# Patient Record
Sex: Male | Born: 1935 | Race: White | Hispanic: No | Marital: Married | Smoking: Former smoker
Health system: Southern US, Community
[De-identification: ages and names within clinical notes are randomized; demographics above are authoritative.]

## PROBLEM LIST (undated history)

## (undated) DIAGNOSIS — D649 Anemia, unspecified: Secondary | ICD-10-CM

## (undated) DIAGNOSIS — E785 Hyperlipidemia, unspecified: Secondary | ICD-10-CM

## (undated) DIAGNOSIS — I350 Nonrheumatic aortic (valve) stenosis: Secondary | ICD-10-CM

## (undated) DIAGNOSIS — N200 Calculus of kidney: Secondary | ICD-10-CM

## (undated) DIAGNOSIS — I2510 Atherosclerotic heart disease of native coronary artery without angina pectoris: Secondary | ICD-10-CM

## (undated) DIAGNOSIS — R918 Other nonspecific abnormal finding of lung field: Secondary | ICD-10-CM

## (undated) DIAGNOSIS — I4891 Unspecified atrial fibrillation: Secondary | ICD-10-CM

## (undated) DIAGNOSIS — I351 Nonrheumatic aortic (valve) insufficiency: Secondary | ICD-10-CM

## (undated) DIAGNOSIS — I10 Essential (primary) hypertension: Secondary | ICD-10-CM

## (undated) DIAGNOSIS — N289 Disorder of kidney and ureter, unspecified: Secondary | ICD-10-CM

## (undated) DIAGNOSIS — I240 Acute coronary thrombosis not resulting in myocardial infarction: Secondary | ICD-10-CM

## (undated) DIAGNOSIS — K279 Peptic ulcer, site unspecified, unspecified as acute or chronic, without hemorrhage or perforation: Secondary | ICD-10-CM

## (undated) HISTORY — DX: Disorder of kidney and ureter, unspecified: N28.9

## (undated) HISTORY — DX: Unspecified atrial fibrillation: I48.91

## (undated) HISTORY — DX: Hyperlipidemia, unspecified: E78.5

## (undated) HISTORY — PX: FINGER AMPUTATION: SHX636

## (undated) HISTORY — DX: Calculus of kidney: N20.0

## (undated) HISTORY — DX: Nonrheumatic aortic (valve) stenosis: I35.0

## (undated) HISTORY — DX: Nonrheumatic aortic (valve) insufficiency: I35.1

## (undated) HISTORY — PX: BILROTH II PROCEDURE: SHX1232

## (undated) HISTORY — DX: Atherosclerotic heart disease of native coronary artery without angina pectoris: I25.10

## (undated) HISTORY — DX: Acute coronary thrombosis not resulting in myocardial infarction: I24.0

## (undated) HISTORY — DX: Other nonspecific abnormal finding of lung field: R91.8

## (undated) HISTORY — DX: Peptic ulcer, site unspecified, unspecified as acute or chronic, without hemorrhage or perforation: K27.9

## (undated) HISTORY — DX: Anemia, unspecified: D64.9

## (undated) HISTORY — DX: Essential (primary) hypertension: I10

---

## 1998-05-01 HISTORY — PX: CORONARY STENT PLACEMENT: SHX1402

## 1998-10-13 ENCOUNTER — Ambulatory Visit (HOSPITAL_COMMUNITY): Admission: RE | Admit: 1998-10-13 | Discharge: 1998-10-13 | Payer: Self-pay | Admitting: Gastroenterology

## 1998-10-19 ENCOUNTER — Ambulatory Visit (HOSPITAL_COMMUNITY): Admission: RE | Admit: 1998-10-19 | Discharge: 1998-10-19 | Payer: Self-pay | Admitting: Gastroenterology

## 1998-11-06 ENCOUNTER — Encounter: Payer: Self-pay | Admitting: Emergency Medicine

## 1998-11-06 ENCOUNTER — Inpatient Hospital Stay (HOSPITAL_COMMUNITY): Admission: EM | Admit: 1998-11-06 | Discharge: 1998-11-09 | Payer: Self-pay | Admitting: Emergency Medicine

## 2003-09-01 ENCOUNTER — Encounter: Admission: RE | Admit: 2003-09-01 | Discharge: 2003-09-29 | Payer: Self-pay | Admitting: Family Medicine

## 2005-10-15 ENCOUNTER — Observation Stay (HOSPITAL_COMMUNITY): Admission: EM | Admit: 2005-10-15 | Discharge: 2005-10-16 | Payer: Self-pay | Admitting: Emergency Medicine

## 2006-01-15 ENCOUNTER — Encounter: Admission: RE | Admit: 2006-01-15 | Discharge: 2006-01-15 | Payer: Self-pay | Admitting: Cardiology

## 2006-07-20 ENCOUNTER — Encounter: Admission: RE | Admit: 2006-07-20 | Discharge: 2006-07-20 | Payer: Self-pay | Admitting: Cardiology

## 2007-11-11 ENCOUNTER — Encounter: Admission: RE | Admit: 2007-11-11 | Discharge: 2007-11-11 | Payer: Self-pay | Admitting: Cardiology

## 2008-05-11 ENCOUNTER — Encounter: Admission: RE | Admit: 2008-05-11 | Discharge: 2008-05-11 | Payer: Self-pay | Admitting: Cardiology

## 2008-05-19 ENCOUNTER — Inpatient Hospital Stay (HOSPITAL_BASED_OUTPATIENT_CLINIC_OR_DEPARTMENT_OTHER): Admission: RE | Admit: 2008-05-19 | Discharge: 2008-05-19 | Payer: Self-pay | Admitting: Cardiology

## 2008-05-19 HISTORY — PX: CARDIAC CATHETERIZATION: SHX172

## 2008-05-26 ENCOUNTER — Ambulatory Visit: Payer: Self-pay | Admitting: Cardiothoracic Surgery

## 2008-05-29 ENCOUNTER — Ambulatory Visit: Admission: RE | Admit: 2008-05-29 | Discharge: 2008-05-29 | Payer: Self-pay | Admitting: Cardiothoracic Surgery

## 2008-05-29 ENCOUNTER — Encounter: Payer: Self-pay | Admitting: Cardiothoracic Surgery

## 2008-06-03 ENCOUNTER — Ambulatory Visit: Payer: Self-pay | Admitting: Cardiothoracic Surgery

## 2008-06-03 ENCOUNTER — Inpatient Hospital Stay (HOSPITAL_COMMUNITY): Admission: RE | Admit: 2008-06-03 | Discharge: 2008-06-08 | Payer: Self-pay | Admitting: Cardiothoracic Surgery

## 2008-06-03 ENCOUNTER — Encounter: Payer: Self-pay | Admitting: Cardiothoracic Surgery

## 2008-06-04 HISTORY — PX: AORTIC VALVE REPLACEMENT: SHX41

## 2008-06-29 ENCOUNTER — Ambulatory Visit: Payer: Self-pay | Admitting: Cardiothoracic Surgery

## 2008-06-29 ENCOUNTER — Encounter: Admission: RE | Admit: 2008-06-29 | Discharge: 2008-06-29 | Payer: Self-pay | Admitting: Cardiothoracic Surgery

## 2008-07-02 ENCOUNTER — Encounter (HOSPITAL_COMMUNITY): Admission: RE | Admit: 2008-07-02 | Discharge: 2008-09-30 | Payer: Self-pay | Admitting: Cardiology

## 2010-02-27 IMAGING — CT CT CHEST W/O CM
2 of 3 series · 15 of 36 positions shown, 18 images · non-contrast
Comparison: Chest CTs 07/20/2006 and 01/15/2006.

CLINICAL DATA: Follow-up pulmonary nodule.

CT CHEST WITHOUT CONTRAST
TECHNIQUE: Multidetector CT imaging of the chest was performed
following the standard protocol without IV contrast.

[Series 2: routine chest · axial · 0.70mm/px · z∈[-295,+5]mm · 12 of 72 slices shown, 15 images]
[im 6/72  mediastinal]
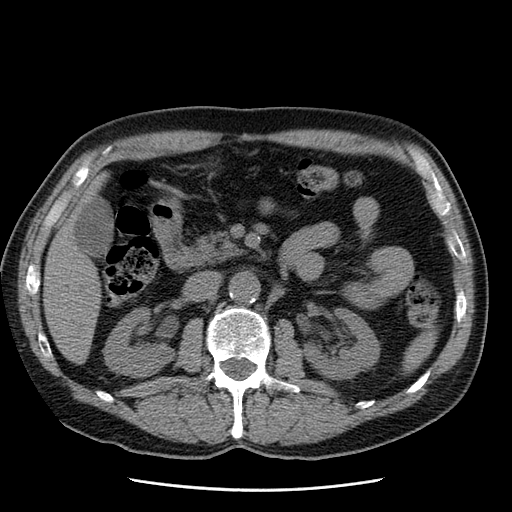
[im 6/72  lung]
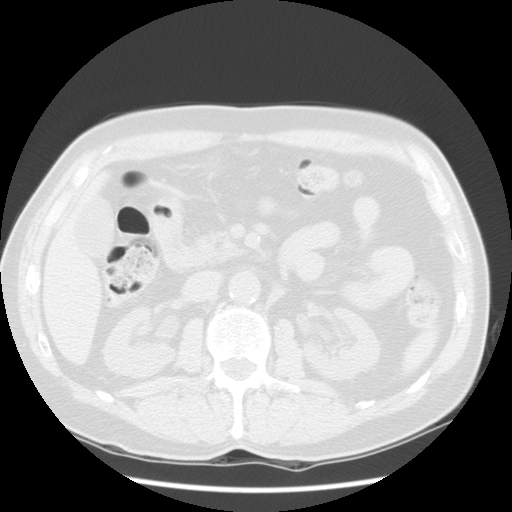
[im 11/72  lung]
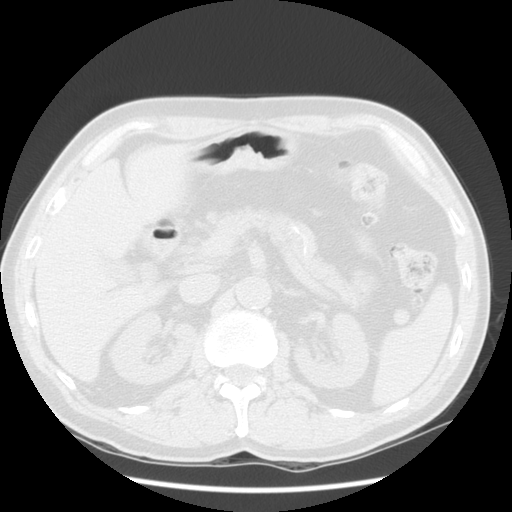
[im 16/72  lung]
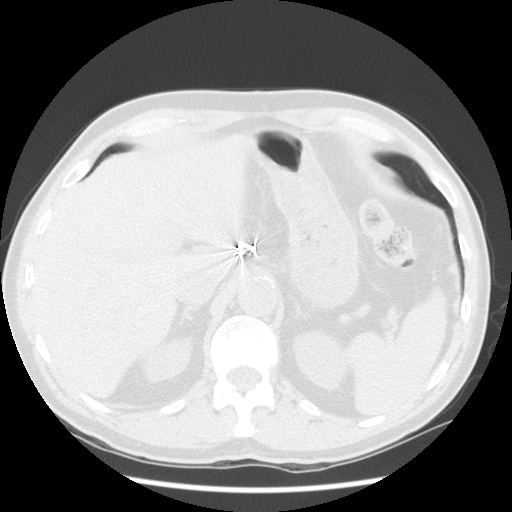
[im 22/72  lung]
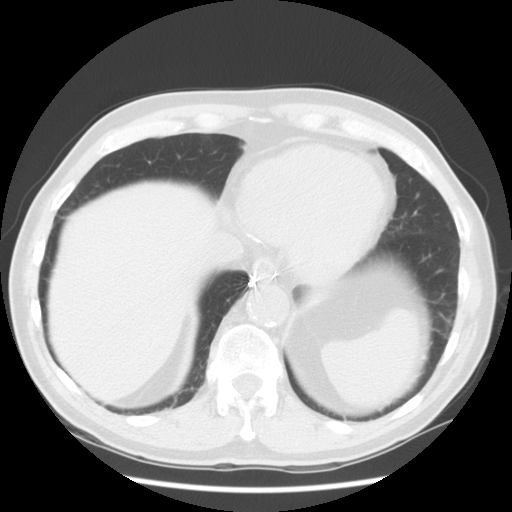
[im 27/72  mediastinal]
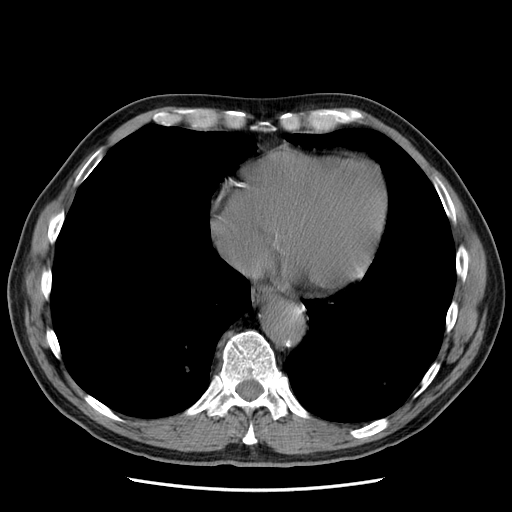
[im 27/72  lung]
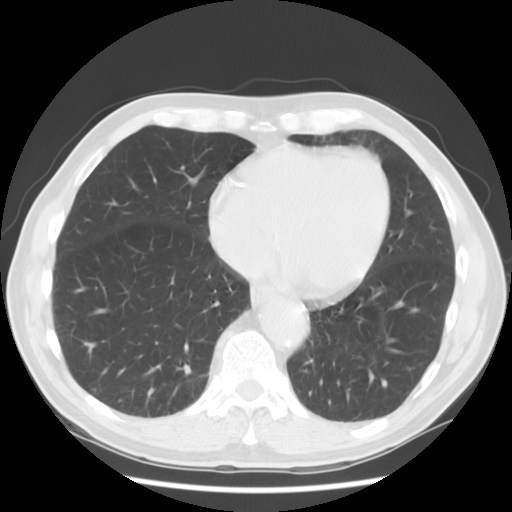
[im 32/72  lung]
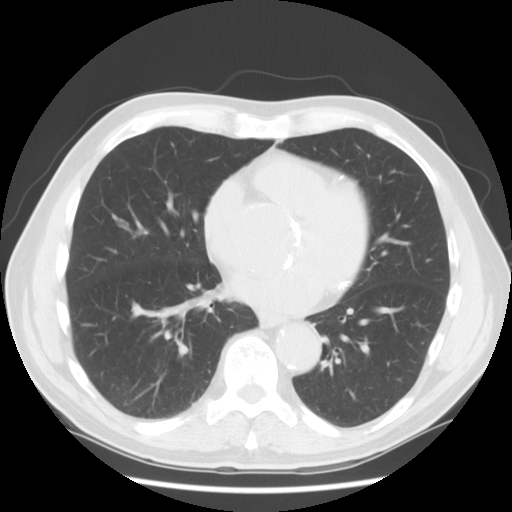
[im 40/72  lung]
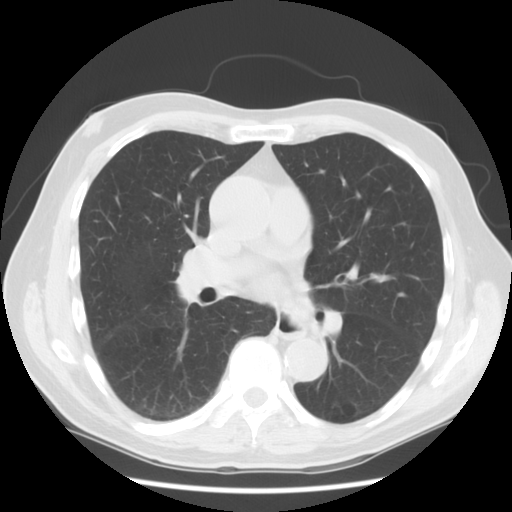
[im 45/72  lung]
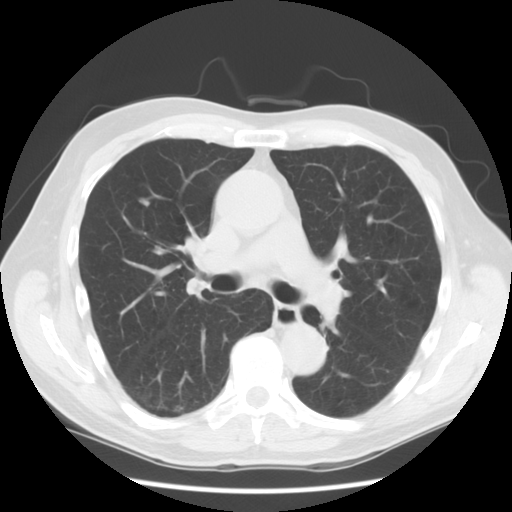
[im 50/72  mediastinal]
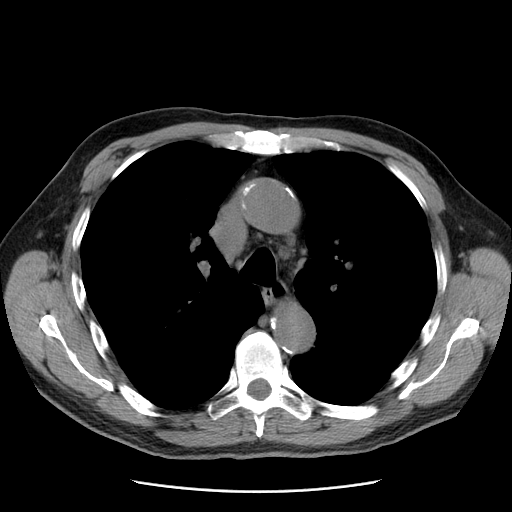
[im 50/72  lung]
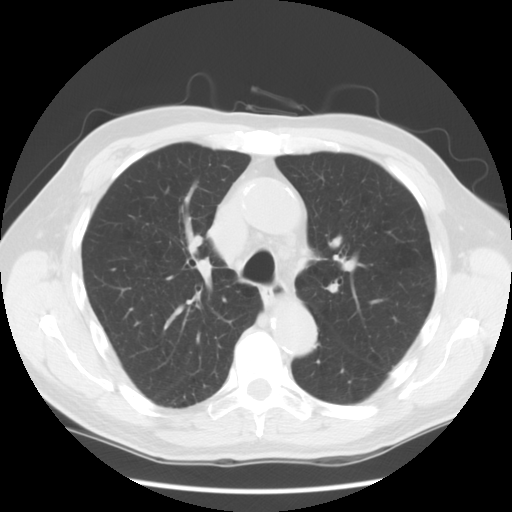
[im 56/72  lung]
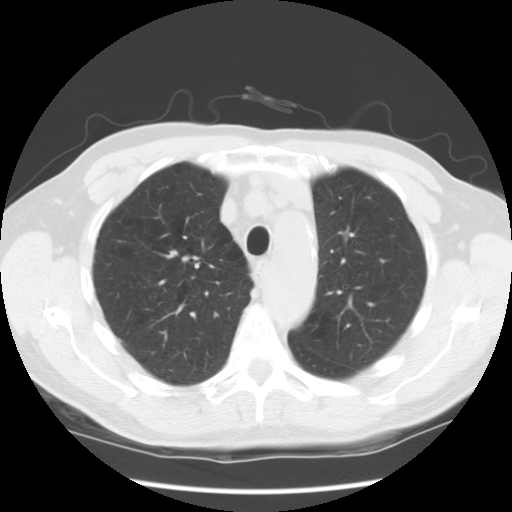
[im 61/72  lung]
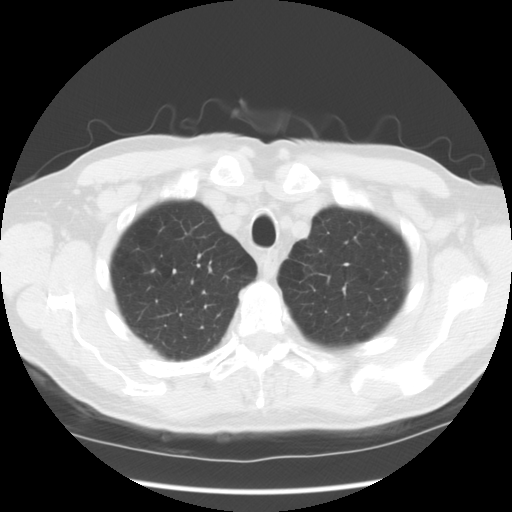
[im 66/72  lung]
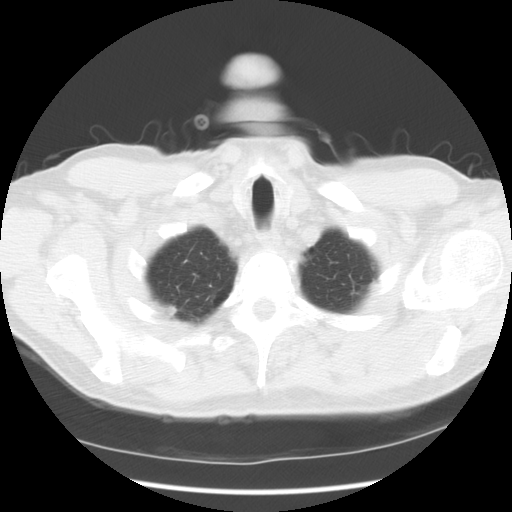

[Series 402: coronal · coronal · 0.70mm/px · 3 of 117 slices shown]
[im 24/117  lung]
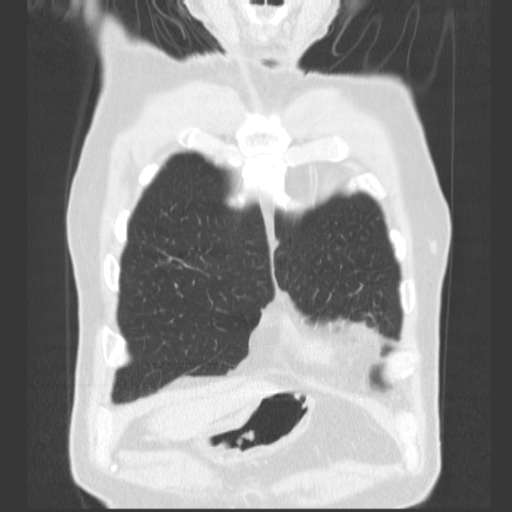
[im 47/117  lung]
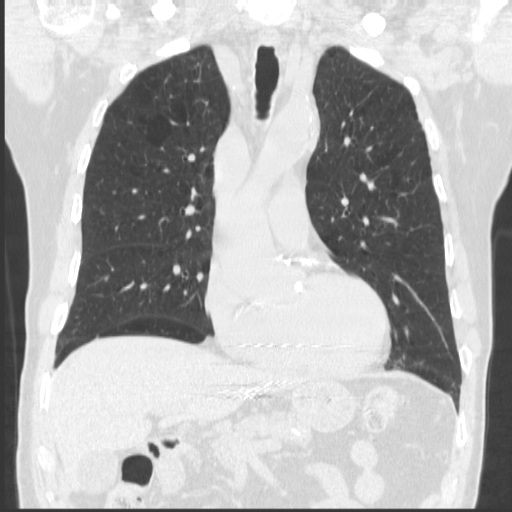
[im 70/117  lung]
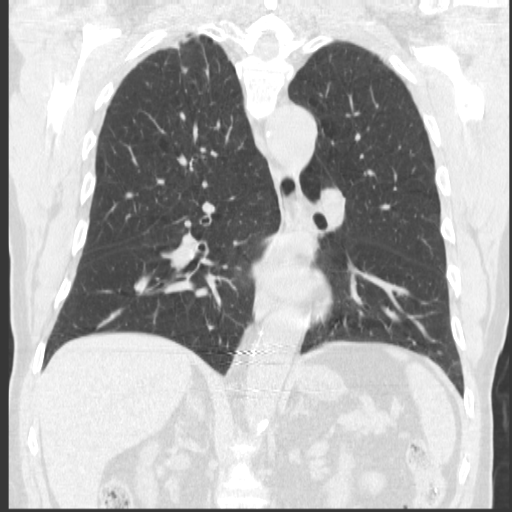

[15 of 36 positions shown; findings below may reference images not displayed]

FINDINGS: There are no enlarged mediastinal or hilar lymph nodes.
Diffuse aortic and coronary artery atherosclerosis appears stable.
There is no pleural or pericardial effusion.

Emphysematous changes are again noted.  There are scattered
subcentimeter nodules in both lungs which are unchanged from the
prior study.  The subpleural density in the superior segment right
lower lobe measuring 6.5 mm on image 32 is unchanged.  No new
pulmonary nodules are identified.

Postsurgical changes in the upper abdomen appear stable.  There is
a small ventral hernia containing only fat.
IMPRESSION: 1.  Overall stable CT of the chest.
2.  Scattered nodularity is unchanged, most compatible with
postinflammatory scarring.
3.  Stable emphysema.
4.  Stable aortic and coronary atherosclerosis.

## 2010-04-20 ENCOUNTER — Ambulatory Visit: Payer: Self-pay | Admitting: Cardiology

## 2010-05-22 ENCOUNTER — Encounter: Payer: Self-pay | Admitting: Cardiology

## 2010-08-15 LAB — COMPREHENSIVE METABOLIC PANEL
ALT: 30 U/L (ref 0–53)
AST: 29 U/L (ref 0–37)
Albumin: 4.2 g/dL (ref 3.5–5.2)
Alkaline Phosphatase: 60 U/L (ref 39–117)
BUN: 19 mg/dL (ref 6–23)
CO2: 20 mEq/L (ref 19–32)
Calcium: 9.2 mg/dL (ref 8.4–10.5)
Chloride: 102 mEq/L (ref 96–112)
Creatinine, Ser: 1.17 mg/dL (ref 0.4–1.5)
GFR calc Af Amer: >60 mL/min (ref >60)
GFR calc non Af Amer: >60 mL/min (ref >60)
Glucose, Bld: 91 mg/dL (ref 70–99)
Potassium: 4.1 mEq/L (ref 3.5–5.1)
Sodium: 135 mEq/L (ref 135–145)
Total Bilirubin: 1 mg/dL (ref 0.3–1.2)
Total Protein: 6.9 g/dL (ref 6.0–8.3)

## 2010-08-15 LAB — BLOOD GAS, ARTERIAL
Acid-base deficit: 0.7 mmol/L (ref 0.0–2.0)
Bicarbonate: 22.9 mEq/L (ref 20.0–24.0)
Drawn by: 181601
FIO2: 0.21 %
O2 Saturation: 96.3 %
Patient temperature: 98.6
TCO2: 24 mmol/L (ref 0–100)
pCO2 arterial: 34.9 mmHg — ABNORMAL LOW (ref 35.0–45.0)
pH, Arterial: 7.434 (ref 7.350–7.450)
pO2, Arterial: 78.2 mmHg — ABNORMAL LOW (ref 80.0–100.0)

## 2010-08-15 LAB — APTT: aPTT: 44 seconds — ABNORMAL HIGH (ref 24–37)

## 2010-08-15 LAB — CBC
HCT: 40.3 % (ref 39.0–52.0)
Hemoglobin: 14 g/dL (ref 13.0–17.0)
MCHC: 34.6 g/dL (ref 30.0–36.0)
MCV: 83 fL (ref 78.0–100.0)
Platelets: 219 10*3/uL (ref 150–400)
RBC: 4.86 MIL/uL (ref 4.22–5.81)
RDW: 12.5 % (ref 11.5–15.5)
WBC: 5.9 10*3/uL (ref 4.0–10.5)

## 2010-08-15 LAB — TYPE AND SCREEN
ABO/RH(D): O POS
Antibody Screen: NEGATIVE

## 2010-08-15 LAB — POCT I-STAT 3, VENOUS BLOOD GAS (G3P V)
pCO2, Ven: 47.7 mmHg (ref 45.0–50.0)
pH, Ven: 7.355 — ABNORMAL HIGH (ref 7.250–7.300)
pO2, Ven: 41 mmHg (ref 30.0–45.0)

## 2010-08-15 LAB — PROTIME-INR
INR: 1.1 (ref 0.00–1.49)
Prothrombin Time: 14 seconds (ref 11.6–15.2)

## 2010-08-15 LAB — URINALYSIS, ROUTINE W REFLEX MICROSCOPIC
Bilirubin Urine: NEGATIVE
Glucose, UA: NEGATIVE mg/dL
Hgb urine dipstick: NEGATIVE
Ketones, ur: NEGATIVE mg/dL
Nitrite: NEGATIVE
Protein, ur: NEGATIVE mg/dL
Specific Gravity, Urine: 1.014 (ref 1.005–1.030)
Urobilinogen, UA: 0.2 mg/dL (ref 0.0–1.0)
pH: 5.5 (ref 5.0–8.0)

## 2010-08-15 LAB — POCT I-STAT 3, ART BLOOD GAS (G3+)
Acid-Base Excess: 1 mmol/L (ref 0.0–2.0)
O2 Saturation: 90 %
pO2, Arterial: 61 mmHg — ABNORMAL LOW (ref 80.0–100.0)

## 2010-08-15 LAB — HEMOGLOBIN A1C
Hgb A1c MFr Bld: 5.8 % (ref 4.6–6.1)
Mean Plasma Glucose: 120 mg/dL

## 2010-08-15 LAB — ABO/RH: ABO/RH(D): O POS

## 2010-08-16 LAB — POCT I-STAT 4, (NA,K, GLUC, HGB,HCT)
Glucose, Bld: 100 mg/dL — ABNORMAL HIGH (ref 70–99)
Glucose, Bld: 101 mg/dL — ABNORMAL HIGH (ref 70–99)
Glucose, Bld: 110 mg/dL — ABNORMAL HIGH (ref 70–99)
Glucose, Bld: 95 mg/dL (ref 70–99)
Glucose, Bld: 97 mg/dL (ref 70–99)
HCT: 20 % — ABNORMAL LOW (ref 39.0–52.0)
HCT: 22 % — ABNORMAL LOW (ref 39.0–52.0)
HCT: 23 % — ABNORMAL LOW (ref 39.0–52.0)
HCT: 28 % — ABNORMAL LOW (ref 39.0–52.0)
HCT: 30 % — ABNORMAL LOW (ref 39.0–52.0)
Hemoglobin: 10.2 g/dL — ABNORMAL LOW (ref 13.0–17.0)
Hemoglobin: 6.8 g/dL — CL (ref 13.0–17.0)
Hemoglobin: 7.5 g/dL — CL (ref 13.0–17.0)
Hemoglobin: 7.8 g/dL — CL (ref 13.0–17.0)
Hemoglobin: 9.5 g/dL — ABNORMAL LOW (ref 13.0–17.0)
Potassium: 3.5 mEq/L (ref 3.5–5.1)
Potassium: 3.6 mEq/L (ref 3.5–5.1)
Potassium: 3.7 mEq/L (ref 3.5–5.1)
Potassium: 3.8 mEq/L (ref 3.5–5.1)
Potassium: 4 mEq/L (ref 3.5–5.1)
Sodium: 138 mEq/L (ref 135–145)
Sodium: 138 mEq/L (ref 135–145)
Sodium: 141 mEq/L (ref 135–145)
Sodium: 141 mEq/L (ref 135–145)
Sodium: 142 mEq/L (ref 135–145)

## 2010-08-16 LAB — POCT I-STAT, CHEM 8
BUN: 16 mg/dL (ref 6–23)
BUN: 24 mg/dL — ABNORMAL HIGH (ref 6–23)
Calcium, Ion: 1.1 mmol/L — ABNORMAL LOW (ref 1.12–1.32)
Calcium, Ion: 1.14 mmol/L (ref 1.12–1.32)
Chloride: 104 mEq/L (ref 96–112)
Chloride: 107 mEq/L (ref 96–112)
Creatinine, Ser: 1.1 mg/dL (ref 0.4–1.5)
Creatinine, Ser: 1.4 mg/dL (ref 0.4–1.5)
Glucose, Bld: 143 mg/dL — ABNORMAL HIGH (ref 70–99)
Glucose, Bld: 190 mg/dL — ABNORMAL HIGH (ref 70–99)
HCT: 26 % — ABNORMAL LOW (ref 39.0–52.0)
HCT: 27 % — ABNORMAL LOW (ref 39.0–52.0)
Hemoglobin: 8.8 g/dL — ABNORMAL LOW (ref 13.0–17.0)
Hemoglobin: 9.2 g/dL — ABNORMAL LOW (ref 13.0–17.0)
Potassium: 4.1 mEq/L (ref 3.5–5.1)
Potassium: 4.3 mEq/L (ref 3.5–5.1)
Sodium: 139 mEq/L (ref 135–145)
Sodium: 139 mEq/L (ref 135–145)
TCO2: 21 mmol/L (ref 0–100)
TCO2: 23 mmol/L (ref 0–100)

## 2010-08-16 LAB — PROTIME-INR
INR: 1.7 — ABNORMAL HIGH (ref 0.00–1.49)
Prothrombin Time: 21.1 seconds — ABNORMAL HIGH (ref 11.6–15.2)

## 2010-08-16 LAB — CBC
HCT: 24 % — ABNORMAL LOW (ref 39.0–52.0)
HCT: 24.1 % — ABNORMAL LOW (ref 39.0–52.0)
HCT: 25.5 % — ABNORMAL LOW (ref 39.0–52.0)
HCT: 26.1 % — ABNORMAL LOW (ref 39.0–52.0)
HCT: 26.3 % — ABNORMAL LOW (ref 39.0–52.0)
HCT: 27.4 % — ABNORMAL LOW (ref 39.0–52.0)
HCT: 28.4 % — ABNORMAL LOW (ref 39.0–52.0)
Hemoglobin: 10 g/dL — ABNORMAL LOW (ref 13.0–17.0)
Hemoglobin: 8.4 g/dL — ABNORMAL LOW (ref 13.0–17.0)
Hemoglobin: 8.4 g/dL — ABNORMAL LOW (ref 13.0–17.0)
Hemoglobin: 8.8 g/dL — ABNORMAL LOW (ref 13.0–17.0)
Hemoglobin: 9.1 g/dL — ABNORMAL LOW (ref 13.0–17.0)
Hemoglobin: 9.3 g/dL — ABNORMAL LOW (ref 13.0–17.0)
Hemoglobin: 9.7 g/dL — ABNORMAL LOW (ref 13.0–17.0)
MCHC: 34.6 g/dL (ref 30.0–36.0)
MCHC: 34.8 g/dL (ref 30.0–36.0)
MCHC: 34.9 g/dL (ref 30.0–36.0)
MCHC: 34.9 g/dL (ref 30.0–36.0)
MCHC: 35.1 g/dL (ref 30.0–36.0)
MCHC: 35.3 g/dL (ref 30.0–36.0)
MCHC: 35.4 g/dL (ref 30.0–36.0)
MCV: 83.1 fL (ref 78.0–100.0)
MCV: 83.3 fL (ref 78.0–100.0)
MCV: 84.1 fL (ref 78.0–100.0)
MCV: 84.1 fL (ref 78.0–100.0)
MCV: 84.6 fL (ref 78.0–100.0)
MCV: 85 fL (ref 78.0–100.0)
MCV: 85.2 fL (ref 78.0–100.0)
Platelets: 101 10*3/uL — ABNORMAL LOW (ref 150–400)
Platelets: 109 10*3/uL — ABNORMAL LOW (ref 150–400)
Platelets: 110 10*3/uL — ABNORMAL LOW (ref 150–400)
Platelets: 110 10*3/uL — ABNORMAL LOW (ref 150–400)
Platelets: 145 10*3/uL — ABNORMAL LOW (ref 150–400)
Platelets: 79 10*3/uL — ABNORMAL LOW (ref 150–400)
Platelets: 88 10*3/uL — ABNORMAL LOW (ref 150–400)
RBC: 2.84 MIL/uL — ABNORMAL LOW (ref 4.22–5.81)
RBC: 2.86 MIL/uL — ABNORMAL LOW (ref 4.22–5.81)
RBC: 2.99 MIL/uL — ABNORMAL LOW (ref 4.22–5.81)
RBC: 3.09 MIL/uL — ABNORMAL LOW (ref 4.22–5.81)
RBC: 3.16 MIL/uL — ABNORMAL LOW (ref 4.22–5.81)
RBC: 3.3 MIL/uL — ABNORMAL LOW (ref 4.22–5.81)
RBC: 3.38 MIL/uL — ABNORMAL LOW (ref 4.22–5.81)
RDW: 12.8 % (ref 11.5–15.5)
RDW: 12.9 % (ref 11.5–15.5)
RDW: 13 % (ref 11.5–15.5)
RDW: 13.1 % (ref 11.5–15.5)
RDW: 13.2 % (ref 11.5–15.5)
RDW: 13.3 % (ref 11.5–15.5)
RDW: 13.4 % (ref 11.5–15.5)
WBC: 11.1 10*3/uL — ABNORMAL HIGH (ref 4.0–10.5)
WBC: 13.2 10*3/uL — ABNORMAL HIGH (ref 4.0–10.5)
WBC: 14 10*3/uL — ABNORMAL HIGH (ref 4.0–10.5)
WBC: 14.9 10*3/uL — ABNORMAL HIGH (ref 4.0–10.5)
WBC: 6 10*3/uL (ref 4.0–10.5)
WBC: 7.6 10*3/uL (ref 4.0–10.5)
WBC: 7.9 10*3/uL (ref 4.0–10.5)

## 2010-08-16 LAB — BASIC METABOLIC PANEL
BUN: 16 mg/dL (ref 6–23)
BUN: 22 mg/dL (ref 6–23)
BUN: 23 mg/dL (ref 6–23)
BUN: 24 mg/dL — ABNORMAL HIGH (ref 6–23)
BUN: 30 mg/dL — ABNORMAL HIGH (ref 6–23)
BUN: 31 mg/dL — ABNORMAL HIGH (ref 6–23)
CO2: 23 mEq/L (ref 19–32)
CO2: 23 mEq/L (ref 19–32)
CO2: 24 mEq/L (ref 19–32)
CO2: 25 mEq/L (ref 19–32)
CO2: 25 mEq/L (ref 19–32)
CO2: 26 mEq/L (ref 19–32)
Calcium: 7.6 mg/dL — ABNORMAL LOW (ref 8.4–10.5)
Calcium: 7.8 mg/dL — ABNORMAL LOW (ref 8.4–10.5)
Calcium: 7.9 mg/dL — ABNORMAL LOW (ref 8.4–10.5)
Calcium: 7.9 mg/dL — ABNORMAL LOW (ref 8.4–10.5)
Calcium: 7.9 mg/dL — ABNORMAL LOW (ref 8.4–10.5)
Calcium: 8.1 mg/dL — ABNORMAL LOW (ref 8.4–10.5)
Chloride: 101 mEq/L (ref 96–112)
Chloride: 105 mEq/L (ref 96–112)
Chloride: 109 mEq/L (ref 96–112)
Chloride: 109 mEq/L (ref 96–112)
Chloride: 109 mEq/L (ref 96–112)
Chloride: 98 mEq/L (ref 96–112)
Creatinine, Ser: 1.27 mg/dL (ref 0.4–1.5)
Creatinine, Ser: 1.34 mg/dL (ref 0.4–1.5)
Creatinine, Ser: 1.34 mg/dL (ref 0.4–1.5)
Creatinine, Ser: 1.35 mg/dL (ref 0.4–1.5)
Creatinine, Ser: 1.39 mg/dL (ref 0.4–1.5)
Creatinine, Ser: 1.51 mg/dL — ABNORMAL HIGH (ref 0.4–1.5)
GFR calc Af Amer: 55 mL/min — ABNORMAL LOW (ref >60)
GFR calc Af Amer: >60 mL/min (ref >60)
GFR calc Af Amer: >60 mL/min (ref >60)
GFR calc Af Amer: >60 mL/min (ref >60)
GFR calc Af Amer: >60 mL/min (ref >60)
GFR calc Af Amer: >60 mL/min (ref >60)
GFR calc non Af Amer: 45 mL/min — ABNORMAL LOW (ref >60)
GFR calc non Af Amer: 50 mL/min — ABNORMAL LOW (ref >60)
GFR calc non Af Amer: 51 mL/min — ABNORMAL LOW (ref >60)
GFR calc non Af Amer: 52 mL/min — ABNORMAL LOW (ref >60)
GFR calc non Af Amer: 52 mL/min — ABNORMAL LOW (ref >60)
GFR calc non Af Amer: 55 mL/min — ABNORMAL LOW (ref >60)
Glucose, Bld: 107 mg/dL — ABNORMAL HIGH (ref 70–99)
Glucose, Bld: 108 mg/dL — ABNORMAL HIGH (ref 70–99)
Glucose, Bld: 119 mg/dL — ABNORMAL HIGH (ref 70–99)
Glucose, Bld: 120 mg/dL — ABNORMAL HIGH (ref 70–99)
Glucose, Bld: 151 mg/dL — ABNORMAL HIGH (ref 70–99)
Glucose, Bld: 152 mg/dL — ABNORMAL HIGH (ref 70–99)
Potassium: 3.9 mEq/L (ref 3.5–5.1)
Potassium: 3.9 mEq/L (ref 3.5–5.1)
Potassium: 4 mEq/L (ref 3.5–5.1)
Potassium: 4 mEq/L (ref 3.5–5.1)
Potassium: 4.2 mEq/L (ref 3.5–5.1)
Potassium: 4.3 mEq/L (ref 3.5–5.1)
Sodium: 130 mEq/L — ABNORMAL LOW (ref 135–145)
Sodium: 133 mEq/L — ABNORMAL LOW (ref 135–145)
Sodium: 136 mEq/L (ref 135–145)
Sodium: 137 mEq/L (ref 135–145)
Sodium: 138 mEq/L (ref 135–145)
Sodium: 139 mEq/L (ref 135–145)

## 2010-08-16 LAB — APTT: aPTT: 51 seconds — ABNORMAL HIGH (ref 24–37)

## 2010-08-16 LAB — GLUCOSE, CAPILLARY
Glucose-Capillary: 106 mg/dL — ABNORMAL HIGH (ref 70–99)
Glucose-Capillary: 108 mg/dL — ABNORMAL HIGH (ref 70–99)
Glucose-Capillary: 109 mg/dL — ABNORMAL HIGH (ref 70–99)
Glucose-Capillary: 111 mg/dL — ABNORMAL HIGH (ref 70–99)
Glucose-Capillary: 116 mg/dL — ABNORMAL HIGH (ref 70–99)
Glucose-Capillary: 116 mg/dL — ABNORMAL HIGH (ref 70–99)
Glucose-Capillary: 119 mg/dL — ABNORMAL HIGH (ref 70–99)
Glucose-Capillary: 120 mg/dL — ABNORMAL HIGH (ref 70–99)
Glucose-Capillary: 122 mg/dL — ABNORMAL HIGH (ref 70–99)
Glucose-Capillary: 122 mg/dL — ABNORMAL HIGH (ref 70–99)
Glucose-Capillary: 123 mg/dL — ABNORMAL HIGH (ref 70–99)
Glucose-Capillary: 138 mg/dL — ABNORMAL HIGH (ref 70–99)
Glucose-Capillary: 139 mg/dL — ABNORMAL HIGH (ref 70–99)
Glucose-Capillary: 159 mg/dL — ABNORMAL HIGH (ref 70–99)
Glucose-Capillary: 160 mg/dL — ABNORMAL HIGH (ref 70–99)
Glucose-Capillary: 161 mg/dL — ABNORMAL HIGH (ref 70–99)
Glucose-Capillary: 179 mg/dL — ABNORMAL HIGH (ref 70–99)
Glucose-Capillary: 94 mg/dL (ref 70–99)
Glucose-Capillary: 96 mg/dL (ref 70–99)
Glucose-Capillary: 98 mg/dL (ref 70–99)
Glucose-Capillary: 99 mg/dL (ref 70–99)

## 2010-08-16 LAB — POCT I-STAT 3, ART BLOOD GAS (G3+)
Acid-Base Excess: 1 mmol/L (ref 0.0–2.0)
Acid-base deficit: 2 mmol/L (ref 0.0–2.0)
Acid-base deficit: 4 mmol/L — ABNORMAL HIGH (ref 0.0–2.0)
Acid-base deficit: 6 mmol/L — ABNORMAL HIGH (ref 0.0–2.0)
Bicarbonate: 20.5 mEq/L (ref 20.0–24.0)
Bicarbonate: 21.2 mEq/L (ref 20.0–24.0)
Bicarbonate: 22.4 mEq/L (ref 20.0–24.0)
Bicarbonate: 26 mEq/L — ABNORMAL HIGH (ref 20.0–24.0)
O2 Saturation: 100 %
O2 Saturation: 95 %
O2 Saturation: 97 %
O2 Saturation: 97 %
Patient temperature: 35.5
Patient temperature: 37.1
Patient temperature: 98.4
TCO2: 22 mmol/L (ref 0–100)
TCO2: 22 mmol/L (ref 0–100)
TCO2: 23 mmol/L (ref 0–100)
TCO2: 27 mmol/L (ref 0–100)
pCO2 arterial: 34.3 mmHg — ABNORMAL LOW (ref 35.0–45.0)
pCO2 arterial: 40.1 mmHg (ref 35.0–45.0)
pCO2 arterial: 41.6 mmHg (ref 35.0–45.0)
pCO2 arterial: 45.7 mmHg — ABNORMAL HIGH (ref 35.0–45.0)
pH, Arterial: 7.261 — ABNORMAL LOW (ref 7.350–7.450)
pH, Arterial: 7.33 — ABNORMAL LOW (ref 7.350–7.450)
pH, Arterial: 7.403 (ref 7.350–7.450)
pH, Arterial: 7.416 (ref 7.350–7.450)
pO2, Arterial: 402 mmHg — ABNORMAL HIGH (ref 80.0–100.0)
pO2, Arterial: 83 mmHg (ref 80.0–100.0)
pO2, Arterial: 89 mmHg (ref 80.0–100.0)
pO2, Arterial: 92 mmHg (ref 80.0–100.0)

## 2010-08-16 LAB — PLATELET COUNT: Platelets: 124 10*3/uL — ABNORMAL LOW (ref 150–400)

## 2010-08-16 LAB — CREATININE, SERUM
Creatinine, Ser: 1.1 mg/dL (ref 0.4–1.5)
GFR calc Af Amer: >60 mL/min (ref >60)
GFR calc non Af Amer: >60 mL/min (ref >60)

## 2010-08-16 LAB — MAGNESIUM
Magnesium: 2.3 mg/dL (ref 1.5–2.5)
Magnesium: 2.3 mg/dL (ref 1.5–2.5)

## 2010-08-16 LAB — HEMOGLOBIN AND HEMATOCRIT, BLOOD
HCT: 24 % — ABNORMAL LOW (ref 39.0–52.0)
Hemoglobin: 8.2 g/dL — ABNORMAL LOW (ref 13.0–17.0)

## 2010-08-16 LAB — RAPID STREP SCREEN (MED CTR MEBANE ONLY): Streptococcus, Group A Screen (Direct): NEGATIVE

## 2010-09-13 NOTE — Cardiovascular Report (Signed)
NAME:  Noah Martin, SPILLER NO.:  [id number]   MEDICAL RECORD NO.:  [id number]          PATIENT TYPE:  OIB   LOCATION:  1961                         FACILITY:  [facility]   PHYSICIAN:  Peter M. Swaziland, M.D.  DATE OF BIRTH:  1929-08-02   DATE OF PROCEDURE:  DATE OF DISCHARGE:  05/19/2008                            CARDIAC CATHETERIZATION   INDICATION FOR PROCEDURE:  A 77 year old white male with known history  of coronary artery disease, prior stenting of the left circumflex  coronary artery, presents with progressive aortic stenosis based on  echocardiographic findings with a bicuspid aortic valve and aortic root  enlargement.  His valve area by echocardiography is 0.7 sq cm with a  mean gradient of 36 mmHg.   PROCEDURES:  1. Right and left heart catheterizations.  2. Coronary and left ventricular angiography.   ACCESS:  Via the right femoral artery and vein using standard Seldinger  technique.   EQUIPMENTS:  A 5-French arterial sheath, 5-French left Judkins 4  catheter, 5-French 3DRC catheter, 4-French pigtail catheter, 7-French  venous sheath with a 7-French balloon-tip Swan-Ganz catheter.   MEDICATIONS:  1. Local anesthesia 1% Xylocaine.  2. Versed 2 mg IV.   CONTRAST:  130 mL of Omnipaque.   HEMODYNAMIC DATA:  Thermodilution cardiac output was 4.4 liters per  minute with an index of 2.3.  Fick cardiac output was calculated at 7.6  liters per minute with an index of 3.9.  Right atrial pressure is 3/2  with a mean of 1 mmHg.  Right ventricular pressure is 24 with an EDP of  5 mmHg.  Pulmonary artery pressure is 24/7 with a mean of 14 mmHg.  Pulmonary capillary wedge pressure is 5/3 with a mean of 2 mmHg.  Aortic  pressure is 114/62 with a mean of 83 mmHg.  Left ventricular pressure is  139 with an EDP of 11 mmHg by pullback.  The aortic valve gradient was  22 mmHg with a mean gradient of 5 mmHg.  Valve area is calculated 1.1 sq  cm with an area index of 0.58.   It was noted that the patient had a  coughing episode at the time of pullback, which may have affected his  hemodynamic measurements with increase in his systolic blood pressure.  There was no significant mitral valve gradient.   ANGIOGRAPHIC DATA:  Left ventriculogram was performed in the RAO view.  This demonstrates normal left ventricular size and contractility with  normal systolic function.  Ejection fraction is estimated 65%.  There is  no regional wall motion abnormality.  There is no significant mitral  insufficiency.   The aortic root angiography was performed in the RAO view also.  This  demonstrates aortic valve is calcified.  The aortic root proximally is  moderately dilated at the sinotubular junction.  There was mild aortic  insufficiency.   Left coronary artery arises and distributes normally.  The left main  coronary artery is normal.   The left anterior descending artery has a 40% ostial stenosis.  There is  diffuse irregularity in the midvessel up to 20-30%.  The first diagonal  branch is a small with an 80% stenosis proximally.  The second diagonal  branch is without significant disease.   The left circumflex coronary artery gives rise to a single large  marginal vessel but then trifurcates.  The proximal to mid circumflex,  there is a stented segment, which is widely patent with less than 20%  residual stenosis.   The right coronary artery arises normally.  It has a 90% stenosis  proximally followed by total occlusion in the midvessel.  There is  filling of right ventricular branch antegrade.  The mid to distal right  coronary artery fills by left-to-right collaterals.   IMPRESSION:  1. Two-vessel obstructive coronary artery disease involving small      diagonal branch.  The right coronary artery is occluded and has      left-to-right collaterals.  2. Normal left ventricular function.  3. Normal pulmonary artery pressures.  4. Moderate aortic stenosis.  5.  Moderate aortic root enlargement with mild aortic insufficiency.           ______________________________  Peter M. Swaziland, M.D.     PMJ/MEDQ  D:  05/19/2008  T:  05/19/2008  Job:  [id number]   cc:   Dr. Foy Guadalajara

## 2010-09-13 NOTE — Op Note (Signed)
NAME:  Noah Martin, Noah Martin NO.:  [id number]   MEDICAL RECORD NO.:  [id number]          PATIENT TYPE:  INP   LOCATION:  2307                         FACILITY:  [facility]   PHYSICIAN:  Sheliah Plane, MD    DATE OF BIRTH:  [birth year]   DATE OF PROCEDURE:  DATE OF DISCHARGE:                               OPERATIVE REPORT   PREOPERATIVE DIAGNOSIS:  Critical aortic stenosis.   POSTOPERATIVE DIAGNOSIS:  Critical aortic stenosis.   SURGICAL PROCEDURES:  Aortic valve replacement with a 25 mm model 2700  [employer] pericardial tissue valve, serial number [id number].  Coronary artery bypass grafting x1 with endovein.   SURGEON:  Sheliah Plane, MD   FIRST ASSISTANT:  Salvatore Decent. Dorris Fetch, MD   BRIEF HISTORY:  The patient is a 77 year old male with progressive  symptoms of dyspnea on exertion, found to have critical aortic stenosis  with the valve area of 0.7.  He has also had known dilatation of his  ascending aorta that has been stable since 2007, right at 4 cm with the  descending aorta of 3.2 cm.  Because of progressive aortic stenosis by  echocardiogram and increasing symptoms, aortic valve replacement was  recommended to the patient.  Because of his age of 26 years, tissue  valve was recommended.  Because of the discussion about replacement of  his ascending aorta and/or root was also discussed with his age of 46  years, and stable size of the aorta for several years and being just 4  cm, it was decided to leave this intact.   DESCRIPTION OF PROCEDURE:  With Swan-Ganz and arterial line monitors in  place, the patient underwent general endotracheal anesthesia without  incidence.  The chest and legs were prepped with Betadine and draped in  usual sterile manner, plus dictated on a separate note is a  transesophageal echo probe that confirmed critical aortic stenosis with  significant amount of calcification and mildly dilated aortic root of  3.8 cm.  The patient  also had a chronically occluded total right  coronary artery and disease in a small first diagonal.  The patient  underwent general endotracheal anesthesia without incidence, and the  chest and legs were prepped with Betadine and draped in the usual  sterile manner.  Using the [employer] endovein harvesting system, the  segment of vein was harvested from the right thigh, was of good quality  and caliber.  Median sternotomy was performed, left pericardium was  opened.  Overall ventricular function appeared preserved.  On  examination of the aorta, it appeared to be marginally, not particularly  thinned out more than suspected and was consistent in size throughout  the ascending aorta and about 4 cm, it was decided to leave it in place.  The patient was systemically heparinized, the ascending aorta was  cannulated without difficulty, the right atrium was cannulated, and  aortic root vent cardioplegia needle was introduced in the ascending  aorta.  The patient was placed on cardiopulmonary bypass 2.4 L per  minute per meter square right superior pulmonary vein vent was placed.  The patient's body temperature was cooled  to 30 degrees.  Aortic  crossclamp was applied and 800 mL of cold blood potassium cardioplegia  was administered.  Attention was turned first to the posterior  descending coronary artery, which arose high along the acute margin of  the heart, then descended across the inferior surface of the heart,  vessel was opened and admitted a 1-1/2-mm probe.  Using running 7-0  Prolene, distal anastomosis was performed with second reverse saphenous  vein graft.  The diagonal that was considered for bypass was extremely  small and decided not to bypass.  Additional cold blood cardioplegia was  administered down the vein graft and also in the aortic root.  A  transverse aortotomy was then performed and this gave good exposure of a  highly calcified aortic valve  with 3 cusps, 2 fused with the  left  coronary cusp being asymmetrically smaller than the other 2.  The valve  was excised and the annulus decalcified.  A 25 pericardial tissue valve  seated well, #2 Tycron pledget sutures were placed circumferentially  around the annulus and the valve was seated easily without obstruction  of the left main coronary artery.  With the valve well seated, care was  taken to remove all loose calcific debris and the aortotomy was closed  with horizontal mattress Prolene sutures over felt strips and a second  layer of running 4-0 running stitch.  The cardioplegia cannulation site  catheter was then removed and a single punch aortotomy was performed.  The vein graft to the right coronary artery was then trimmed to the  appropriate length and anastomosed to the ascending aorta.  Before  complete closure of the proximal anastomosis of the heart was allowed to  passively de-air.  Aortic crossclamp was removed with total crossclamp  time of 91 minutes.  The patient required electric defibrillation to  return to a sinus rhythm.  A 18-gauge needle was introduced in the left  ventricular apex to further de-air the heart.  With the patient in a  sinus rhythm, atrial and ventricular pacing wires were applied.  The  echocardiogram showed good function of the aortic valve without aortic  insufficiency.  The right superior pulmonary vein vent was removed.  The  patient was then ventilated and weaned from cardiopulmonary bypass  without difficulty.  He remained hemodynamically stable.  He was  decannulated in usual fashion.  Protamine sulfate was administered with  operative field hemostatic.  The pericardium was loosely reapproximated.  Two Blake drains, one behind the heart, and one in the substernal area  were left in place.  Sternum was closed with #6 stainless steel wire.  Fascia was closed with interrupted 0 Vicryl, running 3-0 Vicryl  subcutaneous tissue, 4-0 subcuticular stitch in skin edges.  Dry   dressings were applied.  Sponge and needle count was reported as correct  at completion of the procedure.  The patient tolerated the procedure  without obvious complication and was transferred to Surgical Intensive  Care Unit for further postoperative care.      Sheliah Plane, MD  Electronically Signed     EG/MEDQ  D:  06/04/2008  T:  06/05/2008  Job:  [id number]   cc:   Peter M. [place]il[place], M.D.

## 2010-09-13 NOTE — Consult Note (Signed)
NEW PATIENT CONSULTATION   NORTON, BIVINS  DOB:  1930/04/19                                        June 11, 2008  CHART #:  [id number]   The patient was seen in the office today in consultation for critical  aortic stenosis referred by Dr. Peter Swaziland.  Please see the patient's  history and physical dictated on the same day for details of his office  visit.   Sheliah Plane, MD  Electronically Signed   EG/MEDQ  D:  06/11/2008  T:  06/12/2008  Job:  [id number]

## 2010-09-13 NOTE — Discharge Summary (Signed)
NAME:  Noah Martin, Noah Martin NO.:  [id number]   MEDICAL RECORD NO.:  [id number]          PATIENT TYPE:  INP   LOCATION:  2039                         FACILITY:  [facility]   PHYSICIAN:  Sheliah Plane, MD    DATE OF BIRTH:  [birth year]   DATE OF ADMISSION:  06/03/2008  DATE OF DISCHARGE:                               DISCHARGE SUMMARY   FINAL DIAGNOSES:  1. Critical aortic stenosis.  2. Coronary artery disease.   IN-HOSPITAL DIAGNOSES:  1. Acute blood loss anemia postoperatively.  2. Acute renal insufficiency postoperatively.  3. Intraoperative and postoperative atrial fibrillation.   SECONDARY DIAGNOSES:  1. History of nephrolithiasis.  2. Chronic occlusion of right coronary artery.  3. History of peptic ulcer disease.  4. History of dumping syndrome.  5. History of chronic pulmonary nodules, followed by serial CT scans      in 2008-2009 without change.  6. 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  7. 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   IN-HOSPITAL OPERATIONS AND PROCEDURES:  1. Aortic valve replacement of the 25-mm [employer]      pericardial tissue valve.  2. Coronary artery bypass grafting x1 with Endovein.   THE PATIENT'S HISTORY AND PHYSICAL AND HOSPITAL COURSE:  The patient is  a 77 year old male with progressive symptoms of dyspnea on exertion and  found to have critical aortic stenosis with a valve area of 0.7.  He has  also had known dilatation of his ascending aorta that has been stable  since 2007, ascending of 4 cm with the descending aorta of 3.2 cm.  Because of progressive aortic stenosis by echocardiogram and increased  symptoms, an aortic valve replacement was recommended to the patient.  The patient underwent cardiac catheterization which showed two-vessel  obstructive disease involving a small diagonal branch and a chronically  occluded  right coronary artery.  Following this, the patient was seen  and evaluated by Dr. Tyrone Sage.  Dr. Tyrone Sage discussed with the patient  undergoing aortic valve replacement with possible coronary artery bypass  grafting and possible aortic root replacement.  He discussed risks and  benefits with the patient.  The patient acknowledged understanding and  agreed to proceed.  Surgery was scheduled for June 03, 2008.  For  details of the patient's past medical history and physical exam, please  see dictated H&P.   The patient was taken to the operating room on June 03, 2008 where he  underwent an aortic valve replacement using a 25 mm [employer]  pericardial tissue valve.  He also had coronary bypass grafting x1 using  Endovein.  The patient tolerated this procedure well and was transferred  to the intensive care unit in stable condition.  Noted intraoperatively,  the patient did have a brief run of atrial fibrillation, but was noted  to be in normal sinus rhythm at finish of case.  Postoperatively, the  patient was noted to be hemodynamically stable.  He was extubated  evening of surgery.  Post extubation, the patient was  noted to be alert  and oriented x4 and neuro intact.  On the intensive care unit, a chest x-  ray obtained was clear.  Chest tubes were discontinued in normal  fashion.  All drips were able to be weaned and discontinued.  The  patient remained in normal sinus rhythm.  Blood pressure was stable low.  He did have mild acute blood loss anemia but did not require any  transfusion.  The patient was out of bed, ambulating well with cardiac  rehab.  He was progressing well and felt stable for transfer out to 2000  on postop day 2.  On the floor, the patient continued to progress well.  He did have another run of atrial fibrillation, but converted back to  normal sinus rhythm on his own.  He has remained in normal sinus rhythm  since.  Blood pressure remained stable  low.  He had been started on  Lopressor, but it is currently held due to blood pressure.  We will  continue to monitor.  The patient's hemoglobin and hematocrit dropped  slightly, but he remained asymptomatic.  The patient was started on  iron.  Last hemoglobin and hematocrit was 8.4 and 24.1.  Postoperatively, the patient did have slight acute renal insufficiency  with creatinine increased to 1.51.  This was monitored and started to  trend down by postop day 4 to 1.39.  The patient continued to progress  with cardiac rehab.  He was tolerating diet well.  No nausea, vomiting  noted.  All incisions were noted to be clean, dry, and intact and  healing well.   Postop day #4, the patient was noted to be afebrile.  Heart rate and  blood pressure stable.  He was satting greater than 98% on room air.  Most recent lab work showed sodium of 130, potassium of 4.2, chloride of  98, bicarb of 26, BUN of 31, creatinine of 1.39, glucose of 108.  White  blood cell count 6.0, hemoglobin of a 8.4, hematocrit 24, platelet count  of 109.  The patient is tentatively ready for discharge home in the a.m.  pending he remained stable.   FOLLOWUP APPOINTMENTS:  A followup appointment will be arranged with Dr.  Tyrone Sage in 3 weeks.  Our office will contact the patient with this  information.  The patient will need to obtain AP and lateral chest x-ray  30 minutes prior to his appointment.  He will need to follow up with Dr.  Swaziland in 2 weeks.  He will need to contact Dr. Elvis Coil office to make  these arrangements.   ACTIVITY:  The patient was instructed no driving until released to do  so.  No heavy lifting over 10 pounds.  He is told to ambulate 3-4 times  per day, progress as tolerated, and continue his breathing exercises.   INCISIONAL CARE:  The patient was told to shower, washing his incisions  using soap and water.  He is to contact the office if he develops any  drainage or openings from any of his  incision sites.   DIET:  The patient is educated on diet to be low fat, low salt.   DISCHARGE MEDICATIONS:  1. Prevacid 30 mg daily.  2. Vytorin 10/40 mg daily.  3. Detrol LA 4 mg daily.  4. Elocon cream 0.1% p.r.n.  5. Hydrocortisone cream p.r.n.  6. Aspirin 325 mg daily.  7. Metamucil daily p.r.n.  8. Ferrous sulfate 325 mg daily.  9. Folic acid  1 mg daily.  10.Vicodin 1-2 tablets q.4-6 h. p.r.n.      Theda Belfast, [place]      Sheliah Plane, MD  Electronically Signed    KMD/MEDQ  D:  Apr 04, 202010  T:  Apr 04, 202010  Job:  [id number]   cc:   Peter M. [place], M.D.

## 2010-09-13 NOTE — Assessment & Plan Note (Signed)
OFFICE VISIT   Noah Martin, Noah Martin  DOB:  [birth year]                                        June 29, 2008  CHART #:  [id number]   The patient comes in today for postoperative followup.  He is status  post CABG x1 and aortic valve replacement on June 03, 2008.  His  postoperative course was uneventful and he was discharged home in good  condition.  Since his discharge, he has continued to progress well.  He  states that he still tires easily, but is gradually improving his  stamina.  His appetite is still little off.  He saw Dr. Swaziland last week  and was given a good report and asked to follow up again in June.  Overall, he has no new complaints today.   PHYSICAL EXAMINATION:  VITAL SIGNS:  Blood pressure is 129/83, pulse is  76, respirations 16, and O2 sat 98% on room air.  SKIN:  His incisions have all healed well without erythema or drainage.  STERNUM:  Stable to palpation.  HEART:  Regular rate and rhythm without murmurs, rubs, or gallops.  LUNGS:  Clear to auscultation.  EXTREMITIES:  No edema.   Chest x-ray shows normal postoperative changes with no effusion today.   ASSESSMENT AND PLAN:  The patient is doing very well, status post aortic  valve replacement, coronary artery bypass graft.  He has been contacted  by cardiac rehab and will begin the program this week.  I have released  him to start driving at this point and increase his  activity level.  We will see him back as needed for followup and he is  to continue to follow up as directed with Dr. Swaziland.   Sheliah Plane, MD  Electronically Signed   GC/MEDQ  D:  06/29/2008  T:  06/29/2008  Job:  [id number]   cc:   Peter M. [place], M.D.

## 2010-09-13 NOTE — H&P (Signed)
NAME:  Noah Martin, Noah Martin NO.:  [id number]   MEDICAL RECORD NO.:  [id number]          PATIENT TYPE:  INP   LOCATION:                               FACILITY:  [facility]   PHYSICIAN:  Sheliah Plane, MD    DATE OF BIRTH:  05-23-1929   DATE OF ADMISSION:  06/02/2008  DATE OF DISCHARGE:                              HISTORY & PHYSICAL   REQUESTING PHYSICIAN:  Peter M. Swaziland, MD   PRIMARY CARE PHYSICIAN:  Elana Alm. Reade, MD   REASON FOR CONSULTATION:  Aortic stenosis, aortic insufficiency, dilated  aortic root.   HISTORY OF PRESENT ILLNESS:  The patient is a 77 year old male who Dr.  Swaziland has been following over the years with known coronary occlusive  disease.  He now presents with increasing exertionally related shortness  of breath and serial echocardiogram that showed progressive aortic  stenosis.  In 2008, aortic valve had a mean gradient of 24, valve area  of 1.07 sq cm.  Most recently, an echo showed that the valve grade had  progressed to 60 mm with a mean gradient of 36 mm and a valve area of  0.7.  His LV function remained normal.  In addition, he is noted to have  a dilated aortic root.  CT scan done in 2008 and a followup scan in 2009  showed the mid thoracic aorta being approximately 4 cm; however, the  roots at cardiac catheterization appeared more dilated.   The patient notes that he has had increasing difficulty with exertional  shortness of breath.  He remains active, but has cut his activities back  to avoid symptoms.  He denies any pedal edema, denies any syncope.  He  has very rare episodes of lightheadedness.  He denies angina.  In  anticipation of possible need for valve surgery, the patient has had  work done earlier this year and because of his progressive findings on  echocardiogram and symptoms, recently underwent cardiac catheterization  by Dr. Swaziland.   Previous cardiac history includes a stent placement in the circumflex  coronary artery  in 2000.  He has known hypertension treated, known  hyperlipidemia treated.  Denies diabetes.  He was a remote smoker, but  quit more than 40 years ago.  Denies any previous stroke.  Denies renal  insufficiency.   Past medical history is significant for:  1. Kidney stones.  2. Chronic occlusion of right coronary artery.  3. History of peptic ulcer disease.  4. History of dumping syndrome.  5. History of chronic pulmonary nodules followed by serial CT scan in      2008 and 2009 without change.   Previous surgery include:  1. Gastric outlet obstruction secondary to peptic ulcer disease      leading to a Billroth II procedure.  Since then, he has been      bothered by dumping, this was 39 years ago.  2. 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   SOCIAL HISTORY:  The patient is married, lives with his wife.  He is a  retired Lexicographer, rarely drinks alcohol.   Medications  include,  1. Aspirin 325 mg a day.  2. Diovan/hydrochlorothiazide 80/12.5 mg daily.  3. Vytorin 10/40 daily.  4. Prevacid 30 mg a day.  5. Detrol daily.   ALLERGIES:  None known.   FAMILY HISTORY:  The patient has a younger brother who has had coronary  artery bypass surgery.  His mother died with myocardial infarction at  age 27.   CARDIAC REVIEW OF SYSTEMS:  Negative for chest pain or resting shortness  of breath.  He does have exertional shortness of breath and rare  presyncope.  Denies syncope, palpitations, lower extremity edema, or  orthopnea.   GENERAL REVIEW OF SYSTEMS:  Denies any change in weight.  Denies  nocturia, but does have urinary frequency, which is somewhat improved in  the past month with Detrol.  He does have a history of dumping.  He  notes that his cold intolerance has gotten worse.  He denies any blood  in the stool.  He has never had a colonoscopy and he says he never will.  Denies any amaurosis or TIAs.   PHYSICAL EXAMINATION:  VITAL SIGNS:  Blood pressure is  137/83, pulse is  57, respiratory rate 18, O2 sats 97%, height 60, and weight 160 pounds.  GENERAL:  The patient is awake, alert.  Neurologically intact.  NECK:  I do not appreciate any carotid bruits.  LUNGS:  Clear bilaterally.  CARDIAC:  Reveals harsh holosystolic murmur heard throughout the  precordium.  ABDOMEN:  He has a midline abdominal incision from gastric surgery.  EXTREMITIES:  Lower extremities appear to have adequate pain for bypass.  He has 2+ DP and DT pulses bilaterally.  NEUROLOGIC:  Grossly intact.   Cardiac echo, the patient has dilated aortic root, aortic stenosis,  aortic insufficiency, and bicuspid aortic valve.  Peak gradient across  the aortic valve is 3.9 mm/sec with estimated valve area of 0.7 sq cm.  Cardiac catheterization shows two-vessel obstructive disease involving  the small diagonal branch and chronically occluded right coronary artery  confirms diagnosis of aortic stenosis, so the gradient was not as great  as with echo, has moderate enlargement of the aortic root with mild-to-  moderate aortic insufficiency.  The previously placed circumflex stent  appears without significant disease.   IMPRESSION:  1. The patient with progressive aortic stenosis, now symptomatic with      increasing shortness of breath with exertion and presyncope.  2. History of Billroth II procedure from gastric resection for      obstructing ulcer disease 39 years ago.  3. The patient has never had a colonoscopy.   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  I agree with Dr. Elvis Coil  recommendation to proceed with aortic valve replacement.  At the time of  surgery, we will also evaluate his aortic root.  The mid ascending aorta  is approximately 4 cm; however, the root appears to be slightly more  enlarged.  If this is the case,  we will plan to place a tissue valve  either a tissue conduit and replace portion of the ascending aorta or if  the aortic root does not appear to be significantly enlarged, then just  aortic valve replacement and possible bypass to diagonal in right.  The  risks of surgery including death, infection, stroke, myocardial  infarction, bleeding, blood transfusion all have been discussed  with the  patient in detail and with his wife, and his questions have been  answered.  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  We will tentatively plan for surgery on  February 3.      Sheliah Plane, MD  Electronically Signed     EG/MEDQ  D:  05/26/2008  T:  05/27/2008  Job:  [id number]   cc:   Peter M. Swaziland, M.D.  Robert A. Nicholos Johns, M.D.

## 2010-09-13 NOTE — H&P (Signed)
NAME:  Noah Martin, Noah Martin NO.:  [id number]   MEDICAL RECORD NO.:  [id number]           PATIENT TYPE:   LOCATION:                                 FACILITY:   PHYSICIAN:  Peter M. Swaziland, M.D.  DATE OF BIRTH:  November 09, 1929   DATE OF ADMISSION:  05/19/2008  DATE OF DISCHARGE:                              HISTORY & PHYSICAL   HISTORY OF PRESENT ILLNESS:  Noah Martin is a 77 year old white male  admitted for diagnostic cardiac catheterization for further evaluation  of severe aortic stenosis and planning for valve replacement surgery.  He has a known history of coronary artery disease.  He had a non-Q-wave  myocardial infarction in 2000.  At that time, he was noted to have right  coronary occlusion, which was felt to be an old lesion.  He had an  ulcerated lesion in the left circumflex coronary artery, which was  successfully stented.  This was stented with a 3.0 x 31 mm Niroyal  stent.  The patient has done well since that time without significant  anginal symptoms.  However since 2000, he has had progressive aortic  stenosis.  At his initial evaluation in 2000, he had mild aortic  stenosis with moderate aortic insufficiency.  On followup in 2004, his  mean gradient was 10 mmHg with mild aortic stenosis.  He did have some  aortic root enlargement.  This has been followed in 2008.  He had  progression of his aortic stenosis with a mean gradient of 24 mmHg,  valve area of 1.07 sq. cm and more recently, he had progressed to a peak  gradient of 60 mmHg, a mean gradient of 36 mmHg, and valve area 0.7 sq.  cm.  His left ventricular function has remained normal.  In order to  prepare the patient for planned valve surgery, he did have dental  evaluation this year and required a root canal.  He has also had  multiple nodules noted in his lungs on CT scanning, so he had a followup  CT scan in July 2009.  This showed scattered nodularity that was  unchanged from prior evaluation.  It  was felt to be most compatible with  post-inflammatory scarring.  He did have some emphysema noted.  The  patient currently denies any significant chest pain, shortness of  breath, dizziness, or syncope.  However given the progressive nature of  his aortic valve disease, it was felt that he is going to require aortic  valve replacement and possible assessment of his aortic root, also need  to reevaluate his coronary status.  He is now admitted for diagnostic  cardiac catheterization.   PAST MEDICAL HISTORY:  1. Coronary artery disease as noted above with previous stenting of      the left circumflex coronary artery.  2. He has a known occlusion of the right coronary artery.  3. He has a history of peptic ulcer disease and is status post      gastrectomy with Billroth II anastomosis in the past.  4. He has had a prior history of dumping syndrome.  5. He  has severe aortic stenosis.  6. He has a history of hypertension and hypercholesterolemia.  7. He is felt to have a bicuspid aortic valve and echoes have also      demonstrated moderate aortic insufficiency as well as enlargement      of the proximal aorta.  8. He has a history of chronic pulmonary nodules and prior history of      anemia.   CURRENT MEDICATIONS:  1. Aspirin 325 mg per day.  2. Diovan HCT 80/12.5 mg daily.  3. Vytorin 10/40 mg per day.  4. Prevacid 30 mg per day.  5. Detrol daily.   SOCIAL HISTORY:  The patient is married.  He has 3 children.  He is a  retired Paramedic.  He denies tobacco or alcohol use.   FAMILY HISTORY:  He has a younger brother who has had coronary artery  bypass surgery.  His mother died of myocardial infarction at age 32.   REVIEW OF SYSTEMS:  His abdominal cramping and diarrhea have actually  been doing well recently.  He does note some urinary frequency.  He has  had no history of TIA or stroke.  He has had no cough, fever, or chills.  Denies any recent change in appetite.  He has had  no edema, orthopnea or  PND.  All other systems were reviewed and are negative.   PHYSICAL EXAMINATION:  GENERAL:  The patient is a well-developed white  male in no apparent distress.  VITAL SIGNS:  His weight is 167, blood pressure is 124/70, pulse 68 and  regular.  HEENT:  He is normocephalic, atraumatic.  His pupils equal, round, and  reactive to light and accommodation.  Conjunctivae are clear.  Oropharynx is clear.  He has got good dental repair.  NECK:  Supple without JVD, adenopathy, thyromegaly.  His carotid  upstrokes are delayed with radiated murmur.  LUNGS:  Clear to auscultation and percussion.  CARDIAC:  A harsh grade 3/6 holosystolic murmur heard at the right upper  sternal border and apex.  He has no S3.  ABDOMEN:  Soft and nontender without masses or bruits.  His bowel sounds  are positive.  He has some old surgical scars.  EXTREMITIES:  Femoral and pedal pulses are 2+ and symmetric.  He has no  edema or phlebitis.  SKIN:  Warm and dry.  NEUROLOGIC:  He is alert and oriented x4.  Cranial nerves II through XII  are intact.  He has no focal findings.   LABORATORY DATA:  Chest x-ray showed diffuse coarse interstitial  densities, which are chronic.  Otherwise, stable chest.  His coags were  normal.  His glucose was 96, BUN 20, creatinine 1.3, sodium 142,  potassium 4.2, chloride 102, CO2 is 30, calcium is 9.9.  White count is  5500, hemoglobin 13.9, hematocrit 41.7, platelets 250,000.  ECG shows  sinus bradycardia, otherwise, normal.   IMPRESSION:  1. Severe aortic stenosis with moderate aortic insufficiency.  He also      has mild-to-moderate aortic root enlargement.  2. Coronary artery disease with remote non-Q-wave myocardial      infarction.  Status post stenting of the left circumflex coronary      artery in 2000.  Known chronic right coronary artery occlusion.  3. History of peptic ulcer disease status post gastrectomy and      Billroth II anastomosis.  4.  Hypertension.  5. Hyperlipidemia.  6. History of pulmonary nodules, stable.   PLAN:  We will  proceed with diagnostic right and left heart  catheterization, coronary angiography and preparation for surgery.           ______________________________  Peter M. Swaziland, M.D.     PMJ/MEDQ  D:  05/13/2008  T:  05/14/2008  Job:  [id number]   cc:   Dr. Benjamine Sprague

## 2010-09-15 IMAGING — CR DG CHEST 2V
2 series · 2 of 2 positions shown · non-contrast
Comparison: 05/11/2008

CLINICAL DATA: Aortic stenosis with coronary artery disease and
hypertension.  Pre admission evaluation

CHEST - 2 VIEW

[view not recorded (1 of 2)]
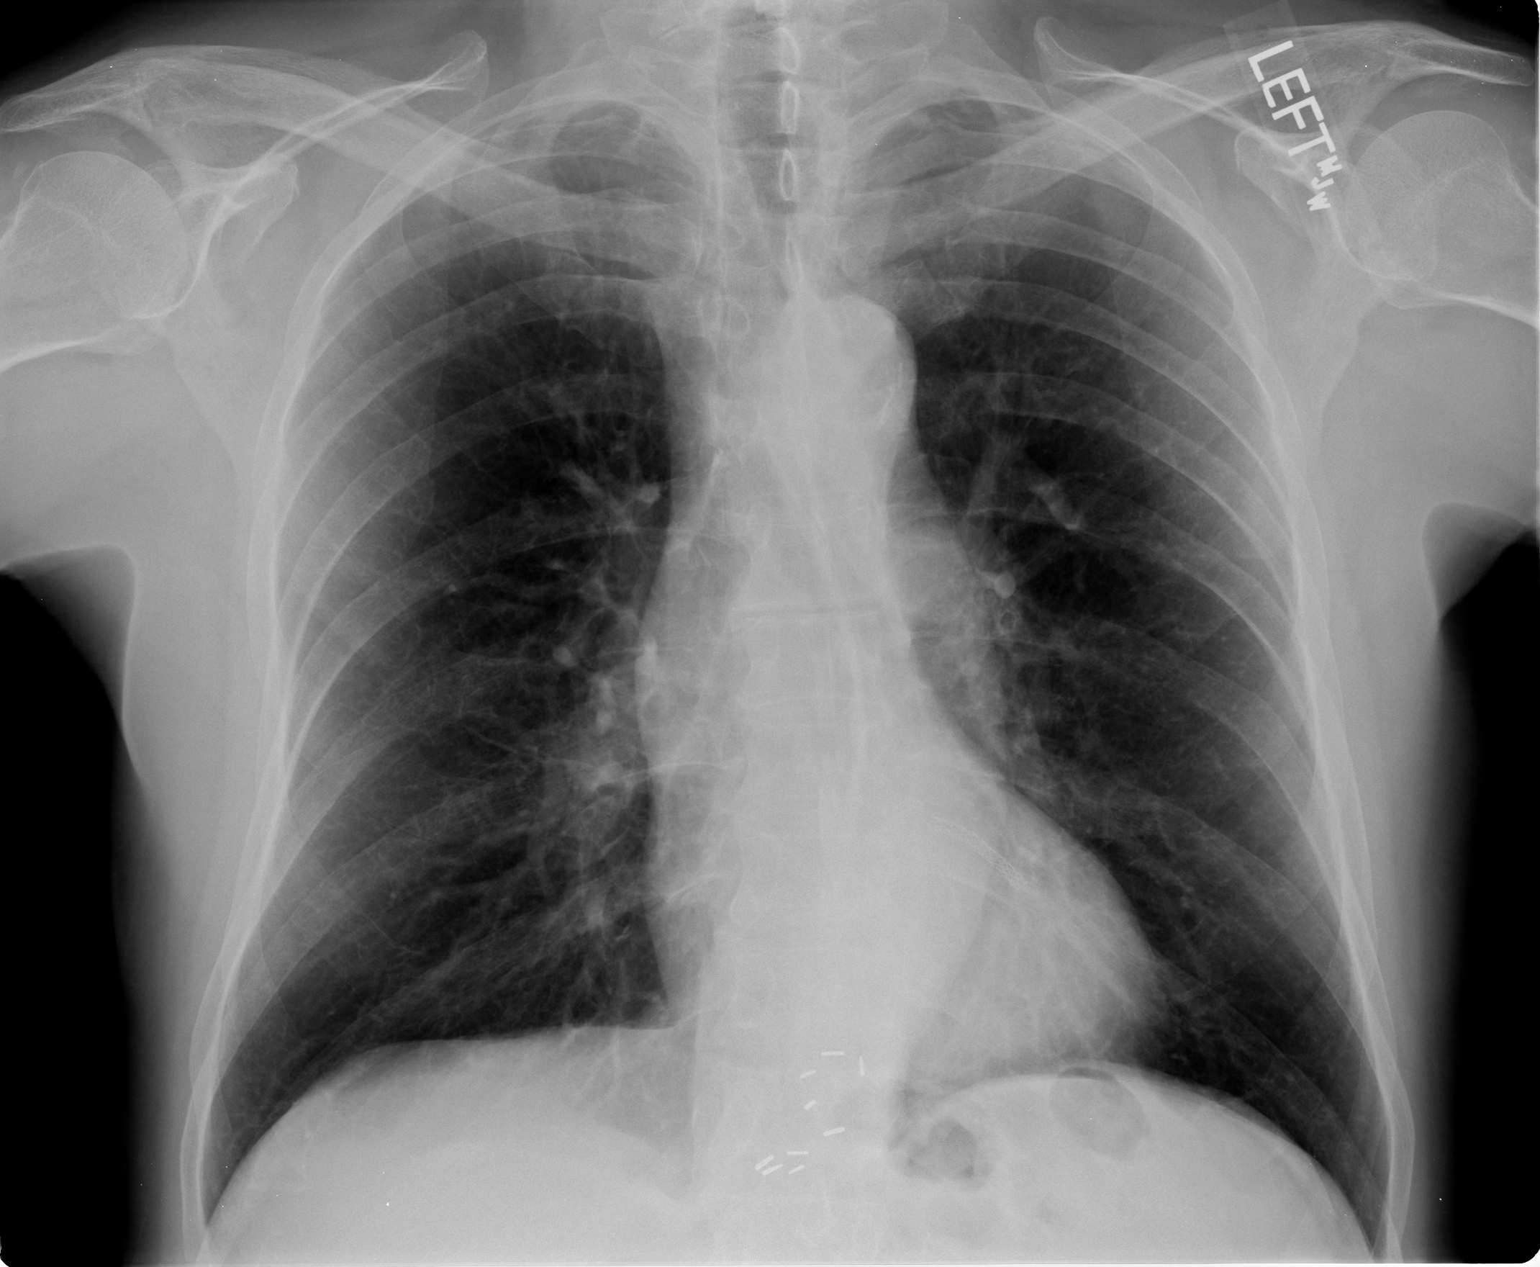

[view not recorded (2 of 2)]
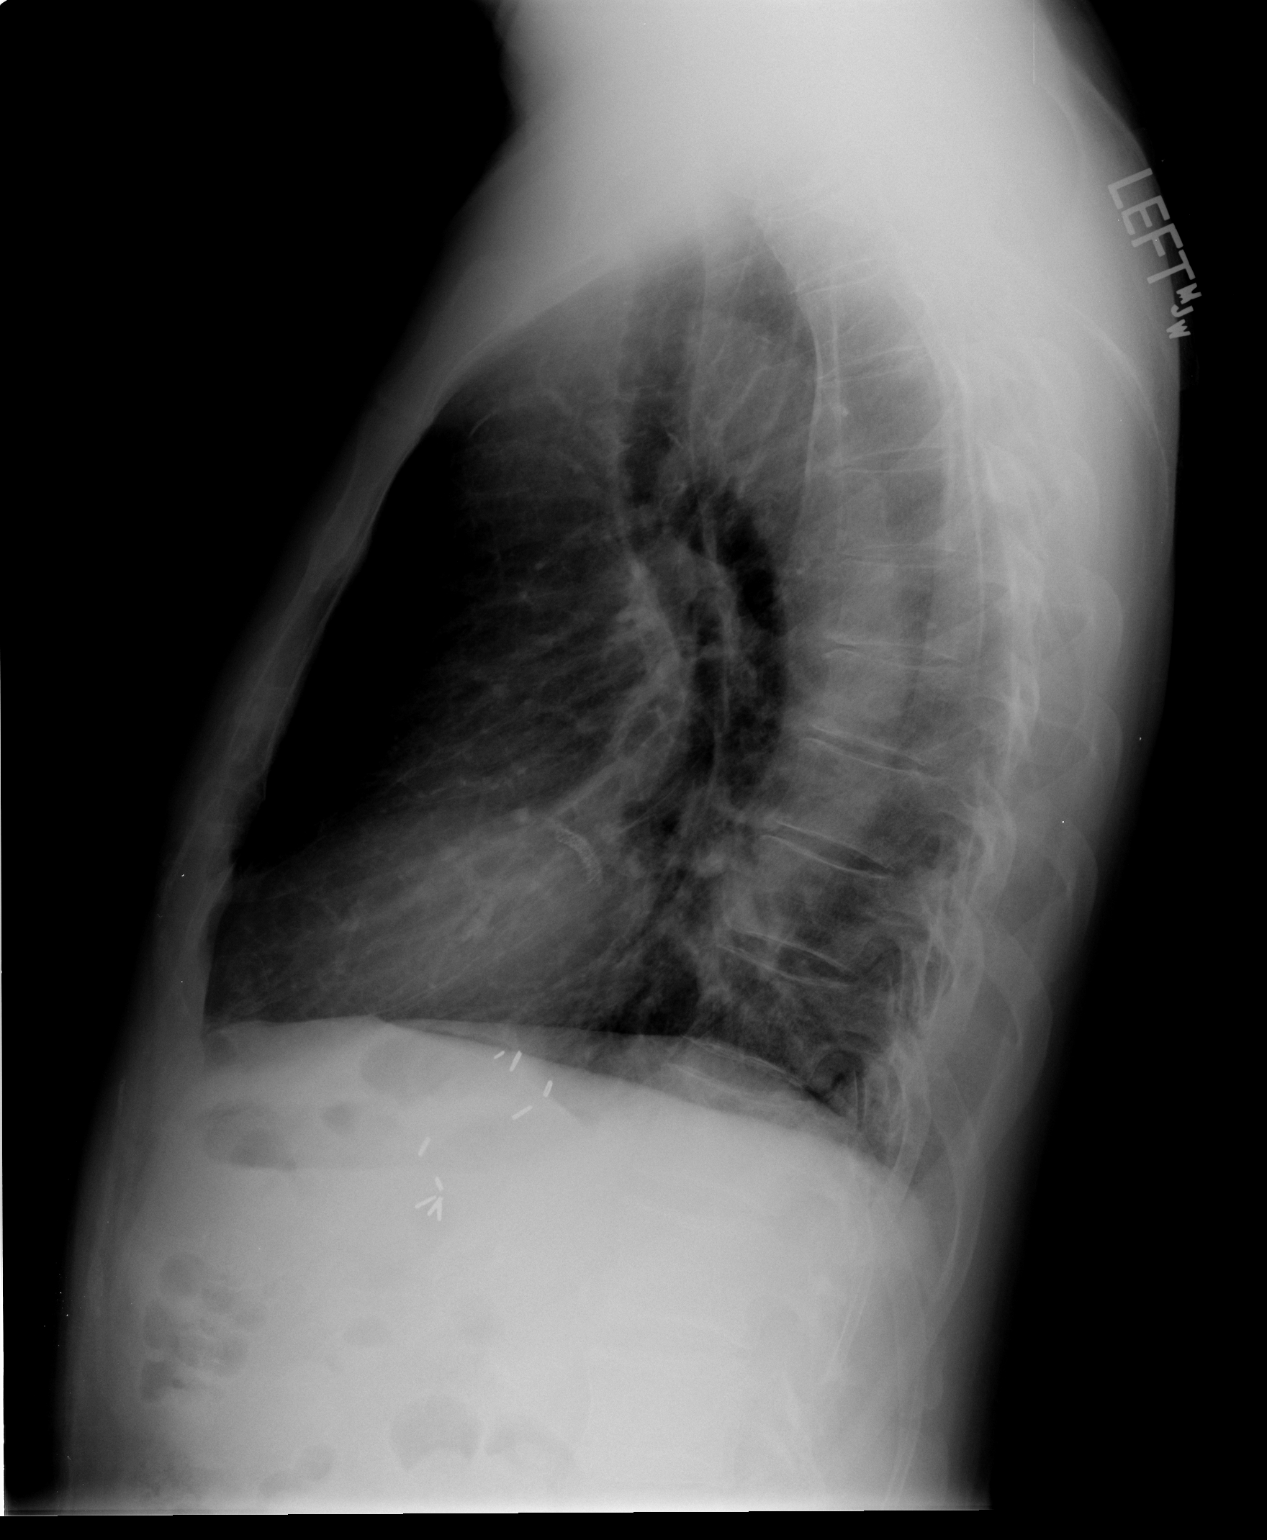

[2 of 2 positions shown; findings below may reference images not displayed]

FINDINGS: The lung fields are hyperinflated compatible with
obstructive pulmonary disease.  Taking this into consideration
heart size is within normal limits.  There is prominence of the
ascending portion of the aorta with ectasia of the aorta.  These
findings are unchanged.  Prominence of the ascending portion of the
aorta would correlate with the patient's history of aortic
stenosis.

The lung fields demonstrate prominence of the pulmonary vascularity
suggesting some degree of pulmonary arterial hypertension.
Coronary vascular stent and surgical clips at the gastroesophageal
junction are unchanged.  No focal infiltrate or signs of congestive
failure seen.  Bony structures are intact.
IMPRESSION: Stable cardiopulmonary appearance with no acute disease noted.

## 2010-09-16 NOTE — H&P (Signed)
NAME:  AMAN, BONET NO.:  [id number]   MEDICAL RECORD NO.:  [id number]          PATIENT TYPE:  INP   LOCATION:  1826                         FACILITY:  [facility]   PHYSICIAN:  Ulyses Amor, MD DATE OF BIRTH:  March 21, 1930   DATE OF ADMISSION:  10/15/2005  DATE OF DISCHARGE:                                HISTORY & PHYSICAL   HISTORY OF PRESENT ILLNESS:  Noah Martin is a 77 year old white male who  is admitted to [facility]  for further evaluation of chest pain.  The patient has history of coronary artery disease which dates back to 2000.  At that time he suffered a non-ST segment elevation myocardial infarction.  He subsequently underwent stenting of the circumflex.  There was a residual  40% LAD lesion as well as a chronically occluded right coronary artery with  collateral circulation.  His cardiac course has been uncomplicated since  then.  There has been no history of congestive heart failure or arrhythmia.   The patient presented to the emergency department this evening with history  of chest discomfort which began at approximately 6 p.m. The chest discomfort  is described as a pressure in the left anterior chest.  It does not radiate.  It is not associated with dyspnea, diaphoresis, or nausea.  It has waxed and  waned though never completely resolved over the course of the evening.  There are no exacerbating or ameliorating factors.  It appears not to be  related to position, activity, meals or respirations.  He took three  nitroglycerin tablets over the course of the evening which appeared to have  no affect on the discomfort.  His discomfort has improved, though not  completely resolved, since his presentation to the emergency department.  The patient is unsure as to whether or not this chest discomfort is similar  to that which heralded his myocardial infarction.   The patient has a number of risk factors for coronary artery disease  including hypertension, dyslipidemia, and family history of early coronary  disease (brother).  There is no history of diabetes mellitus or smoking.   The patient has a history of peptic ulcer disease.  He underwent surgery for  that years ago and he now suffers from dumping syndrome.   On a prior echocardiogram the patient was noted to have moderate aortic  insufficiency with a markedly dilated aortic root.  He also had mild mitral  regurgitation.   MEDICATIONS:  1.  Vytorin 10/20 mg.  2.  Hydrochlorothiazide.  3.  Aspirin.   ALLERGIES:  None.   PAST SURGICAL HISTORY:  Peptic ulcer surgery as described above.   SIGNIFICANT INJURIES:  None.   SOCIAL HISTORY:  The patient is a retired Paramedic.  He lives with his  wife.  He neither smokes cigarettes or drinks alcohol.   REVIEW OF SYSTEMS:  No problems related to his head, eyes, ears, nose,  mouth, throat, lungs, gastrointestinal system, genitourinary system, or  extremities.  There is no history of neurological or psychiatric disorder.  There is no history of fever, chills, or weight loss.  PHYSICAL EXAMINATION:  VITAL SIGNS:  Blood pressure 104/59, pulse 49 and  regular, respirations 20, temperature 98.1.  GENERAL:  The patient was an elderly white man in no discomfort.  He was  alert, oriented, appropriate, and responsive.  HEENT:  Normal.  NECK:  Without thyromegaly or adenopathy.  Carotid pulses were palpable  bilaterally and without bruits.  CARDIAC:  Normal S1 and S2.  No S3, S4, rub, or click.  There was a grade  2/6 pansystolic murmur heard best at the apex.  Cardiac rhythm is regular.  CHEST:  No chest wall tenderness was noted.  LUNGS:  Clear.  ABDOMEN:  Soft, nontender.  There was no mass, hepatosplenomegaly, bruit,  distention, rebound, guarding, or rigidity.  Bowel sounds were normal.  RECTAL/GENITAL:  Not performed as they were not pertinent to the reason for  acute care hospitalization.  EXTREMITIES:   Without edema, deviation, or deformity.  Radial and dorsalis  pedis pulses were palpable bilaterally.  NEUROLOGIC:  Brief screening  neurological survey was unremarkable.   LABORATORY DATA:  The electrocardiogram revealed sinus bradycardia with  first-degree AV block.  There was possible evidence of a prior inferior  myocardial infarction  The possibility of a prior anterior myocardial  infarction could not be excluded.  Lateral there was flattening noted.  There were no changes specific for ischemia or infarction.  The chest  radiograph, according to the radiologist, demonstrated no evidence of acute  cardiopulmonary disease.  The chest CT scan demonstrated no evidence of  pulmonary embolus.  The initial set of cardiac markers revealed a myoglobin  of 92, CK-MB less than 1, troponin less than 0.05.  White count 5 with  hemoglobin of 12.4, hematocrit 35.  Potassium was 3.8, BUN 25,  creatinine  1.3.  Fibrin derivatives were 1.21.  The remaining studies were pending at  the time of this dictation.   IMPRESSION:  1.  Chest pain, rule out unstable angina.  2.  Coronary artery disease, status post non-ST segment elevation myocardial      infarction,  resulting in circumflex stenting in 2000.  Residual 40% LAD      lesion and chronically occluded right coronary artery were noted.  3.  Hypertension.  4.  Dyslipidemia.  5.  History of peptic ulcer disease, dumping syndrome, status post peptic      ulcer disease surgery.  6.  Moderate aortic insufficiency with a markedly dilated aortic root.   PLAN:  1.  Telemetry.  2.  Serial cardiac enzymes.  3.  Aspirin.  4.  Intravenous heparin.  5.  Intravenous nitroglycerin.  6.  No beta blocker as heart rate was in the 60s and 70s.  7.  Further measures per Dr. Swaziland.      Ulyses Amor, MD     MSC/MEDQ  D:  10/16/2005  T:  10/16/2005  Job:  [id number]

## 2010-09-16 NOTE — Discharge Summary (Signed)
NAME:  AKIEM, URIETA NO.:  [id number]   MEDICAL RECORD NO.:  [id number]          PATIENT TYPE:  INP   LOCATION:  2016                         FACILITY:  [facility]   PHYSICIAN:  Peter M. Swaziland, M.D.  DATE OF BIRTH:  [birth year]   DATE OF ADMISSION:  10/15/2005  DATE OF DISCHARGE:  10/16/2005                                 DISCHARGE SUMMARY   HISTORY OF PRESENT ILLNESS:  Mr. Noah Martin is a 77 year old white male with  known history of coronary artery disease.  He is status post a non ST-  segment myocardial infarction in 2000.  At that time, he underwent stenting  of the left circumflex coronary.  He had a 40% narrowing in the LAD and his  right coronary was chronically occluded with left-to-right collaterals.  He  has done very well since then.  He presented on day of admission with  refractory, left precordial chest pain he describes as a dull ache that  waxed and waned in intensity.  It persisted for several hours.  It was not  relieved with sublingual nitroglycerin and he presented to the emergency  room for further evaluation.  The patient's last Cardiolite study was in  2004.  He had mild inferior wall attenuation at that time.  Otherwise no  significant ischemia.   For details of his past medical history, social history, family history and  physical exam, please see admission history and physical.   LABORATORY DATA:  ECG shows normal sinus rhythm with small Q-waves  inferiorly.  There is nonspecific T-wave abnormality.  There are no acute ST-  segment changes noted.  Chest x-ray shows no active disease.  His white  count was 5000, hemoglobin 12.4, hematocrit 35, platelets 247,000.  Sodium  139, potassium 3.8, chloride 109, CO2 25, BUN 25, creatinine 1.3, glucose of  118.  Coags were normal.  Liver function studies were normal.  Point of care  cardiac enzymes were negative x3.  Subsequent cardiac enzymes showed a CK of  118 with 1.8 MB and then 93 with 1.4 MB.   Troponins were 0.05 and 0.01.  D-  dimer test was 1.21.   HOSPITAL COURSE:  The patient was admitted.  He was placed on telemetry  monitor.  He was treated with IV nitroglycerin and heparin.  He underwent a  CT of the chest with contrast.  This was negative for pulmonary embolus.  The aortic root was mildly enlarged for 4 1/2 cm.  He was noted to have a 9  mm pulmonary nodule peripherally.  His chest pain resolved.  As noted,  serial cardiac enzymes were negative for myocardial infarction.  His ECG was  unchanged.  On 10/16/2005, he underwent a stress Cardiolite study.  He was  able to walk 6 minutes and 23 seconds with a maximum METS of 7.  His peak  heart rate was 130.  He experienced no chest pain and was limited by leg  fatigue.  He had no significant ST-segment changes.  Cardiolite images  demonstrated mild reversible defect involving the inferior septum at the  base.  No other  ischemia was noted.  His ejection fraction was normal at  56%.  These findings were felt to be consistent with his known right  coronary occlusion.  It was felt that he was stable for discharge at this  point.   DISCHARGE DIAGNOSES:  1.  Chest pain, probably musculoskeletal.  2.  Coronary disease with remote stenting of the left circumflex coronary in      2000.  Chronic total occlusion of the right coronary artery.  3.  Hypertension.  4.  Hypercholesterolemia.  5.  History of peptic ulcer disease status post surgery with chronic dumping      syndrome.  6.  History of moderate aortic insufficiency with mildly dilated aortic      root.   DISCHARGE MEDICATIONS:  1.  Coated aspirin 325 mg per day.  2.  Diovan/HCT 80/12.5 mg daily.  3.  Vytorin 10/20 mg per day.  4.  Prevacid 30 mg per day.   Recommend followup with Dr. Swaziland in approximately 3 weeks and we will  check a lipid panel at that time.  It was recommended that he have a follow-  up CT of the chest in 3 months to follow up his pulmonary  nodule.   DISCHARGE STATUS:  Improved.           ______________________________  Peter M. Swaziland, M.D.     PMJ/MEDQ  D:  10/16/2005  T:  10/17/2005  Job:  [id number]

## 2010-10-18 ENCOUNTER — Other Ambulatory Visit: Payer: Self-pay | Admitting: Cardiology

## 2010-10-18 NOTE — Telephone Encounter (Signed)
Med refill

## 2010-10-21 ENCOUNTER — Telehealth: Payer: Self-pay | Admitting: Cardiology

## 2010-10-21 NOTE — Telephone Encounter (Signed)
Patient has a question about one of the medicines that he is taking.

## 2010-10-24 NOTE — Telephone Encounter (Signed)
Called stating he has been having muscle aches and pains since starting Liptior. Has tried Zocor and Vytorin in past. Doesn't remember why stopped those. Advised to stop Lipitor x 2 wks and let us know if that helps. Will call us back.

## 2010-12-05 ENCOUNTER — Encounter: Payer: Self-pay | Admitting: *Deleted

## 2010-12-08 ENCOUNTER — Ambulatory Visit (INDEPENDENT_AMBULATORY_CARE_PROVIDER_SITE_OTHER): Payer: Medicare Other | Admitting: Cardiology

## 2010-12-08 ENCOUNTER — Other Ambulatory Visit: Payer: Self-pay | Admitting: *Deleted

## 2010-12-08 ENCOUNTER — Encounter: Payer: Self-pay | Admitting: Cardiology

## 2010-12-08 VITALS — BP 142/78 | HR 60 | Ht 69.0 in | Wt 156.0 lb

## 2010-12-08 DIAGNOSIS — I350 Nonrheumatic aortic (valve) stenosis: Secondary | ICD-10-CM

## 2010-12-08 DIAGNOSIS — I2510 Atherosclerotic heart disease of native coronary artery without angina pectoris: Secondary | ICD-10-CM

## 2010-12-08 DIAGNOSIS — I359 Nonrheumatic aortic valve disorder, unspecified: Secondary | ICD-10-CM

## 2010-12-08 DIAGNOSIS — E785 Hyperlipidemia, unspecified: Secondary | ICD-10-CM

## 2010-12-08 DIAGNOSIS — I10 Essential (primary) hypertension: Secondary | ICD-10-CM

## 2010-12-08 MED ORDER — SIMVASTATIN 40 MG PO TABS: 40.0000 mg | ORAL_TABLET | Freq: Every evening | ORAL | Status: DC

## 2010-12-08 NOTE — Assessment & Plan Note (Signed)
Since he is developing myalgias on Lipitor we will switch him back to simvastatin 40 mg per day since he tolerated this well before.

## 2010-12-08 NOTE — Assessment & Plan Note (Signed)
He is asymptomatic. He has had previous single-vessel bypass surgery.

## 2010-12-08 NOTE — Patient Instructions (Signed)
We will switch your lipitor to simvastatin (Zocor) 40 mg daily  Continue your other medications.  We will see you again in 6 months with fasting lab work.

## 2010-12-08 NOTE — Assessment & Plan Note (Signed)
He remains asymptomatic. His valve is normal by exam. I will followup again in 6 months.

## 2010-12-08 NOTE — Progress Notes (Signed)
Dionne Ano Mckim Date of Birth: 1929-09-20   History of Present Illness: Mr. Sivertsen is seen today for followup. He reports that he has had increasing aching in his shoulders and bones. He attributes this to the Lipitor. He previously had taken Vytorin without any difficulty but was switched because of cost. He denies any chest pain or shortness of breath. He denies any palpitations. He remains very active.  Current Outpatient Prescriptions on File Prior to Visit  Medication Sig Dispense Refill  . aspirin 325 MG tablet Take 325 mg by mouth daily.        . lansoprazole (PREVACID) 30 MG capsule Take 30 mg by mouth daily.        Marland Kitchen lisinopril (PRINIVIL,ZESTRIL) 20 MG tablet TAKE ONE TABLET BY MOUTH DAILY  30 tablet  5    No Known Allergies  Past Medical History  Diagnosis Date  . Hyperlipidemia   . Hypertension   . Aortic stenosis     status post aortic valve replacement  . Peptic ulcer     status post gastrectomy  . Coronary artery disease     status post CABG x1 -- with endo vein to the RCA  . Pulmonary nodules     chronic -- 2008-2009 without change  . Anemia   . Chest pain   . Aortic insufficiency     mild, -- with a markedly dilated aortic root  . Nephrolithiasis   . Acute renal insufficiency     postoperatively  . Atrial fibrillation     Intraoperative and postoperative atrial fibrillation  . Right coronary artery occlusion     Chronic occlusion of right coronary artery    Past Surgical History  Procedure Date  . Cardiac catheterization 05/19/2008    Two-vessel obstructive CAD involving small diagonal branch.  The RCA is occluded and has left-to-right collaterals --  Normal LV function -- Normal pulmonary artery pressures -- Moderate aortic stenosis -- Moderate aortic root enlargement with mild aortic insufficiency     . Aortic valve replacement 06/04/2008    Aortic valve replacement with a 25 mm model 2700  [employer] pericardial tissue valve, serial  number [id number] -- Coronary artery bypass grafting x1 with endovein  . Bilroth ii procedure     anastomosis -- Peptic ulcer surgery  . Finger amputation     post partial amputation of right index finger  . Coronary stent placement 2000    stent placement in the circumflex coronary artery    History  Smoking status  . Former Smoker -- 1.0 packs/day for 14 years  . Types: Cigarettes  . Quit date: 12/04/1972  Smokeless tobacco  . Not on file    History  Alcohol Use No    Family History  Problem Relation Age of Onset  . Coronary artery disease Brother     early onset / with CABG  . Heart attack Mother     Review of Systems: The review of systems is positive for myalgias and bony pain.  All other systems were reviewed and are negative.  Physical Exam: BP 142/78  Pulse 60  Ht 5\' 9"  (1.753 m)  Wt 156 lb (70.761 kg)  BMI 23.04 kg/m2 He is a pleasant white male in no acute distress. He is normocephalic, atraumatic. Pupils are equal round and reactive to light and accommodation. Extraocular movements are full. He has no JVD or bruits. Carotid upstrokes are normal. Lungs are clear. Cardiac exam reveals a grade 2/6 systolic ejection murmur  at the right upper sternal border. Abdomen is soft and nontender. He has no masses or bruits. Extremities are without edema. Pedal pulses are good. Skin is warm and dry. He is alert oriented x3. Cranial nerves II through XII are intact. LABORATORY DATA:   Assessment / Plan:

## 2010-12-08 NOTE — Assessment & Plan Note (Signed)
Blood pressure control is satisfactory today. We will continue on his current therapy.

## 2011-03-19 ENCOUNTER — Other Ambulatory Visit: Payer: Self-pay | Admitting: Cardiology

## 2011-06-07 ENCOUNTER — Other Ambulatory Visit: Payer: Self-pay

## 2011-06-07 ENCOUNTER — Encounter: Payer: Self-pay | Admitting: Cardiology

## 2011-06-07 ENCOUNTER — Ambulatory Visit (INDEPENDENT_AMBULATORY_CARE_PROVIDER_SITE_OTHER): Payer: Medicare Other | Admitting: Cardiology

## 2011-06-07 ENCOUNTER — Other Ambulatory Visit: Payer: Medicare Other | Admitting: *Deleted

## 2011-06-07 VITALS — BP 150/80 | HR 52 | Ht 69.0 in | Wt 157.0 lb

## 2011-06-07 DIAGNOSIS — E7800 Pure hypercholesterolemia, unspecified: Secondary | ICD-10-CM

## 2011-06-07 DIAGNOSIS — I359 Nonrheumatic aortic valve disorder, unspecified: Secondary | ICD-10-CM

## 2011-06-07 DIAGNOSIS — I2510 Atherosclerotic heart disease of native coronary artery without angina pectoris: Secondary | ICD-10-CM

## 2011-06-07 DIAGNOSIS — I10 Essential (primary) hypertension: Secondary | ICD-10-CM

## 2011-06-07 DIAGNOSIS — Z952 Presence of prosthetic heart valve: Secondary | ICD-10-CM

## 2011-06-07 DIAGNOSIS — E785 Hyperlipidemia, unspecified: Secondary | ICD-10-CM

## 2011-06-07 DIAGNOSIS — Z954 Presence of other heart-valve replacement: Secondary | ICD-10-CM

## 2011-06-07 DIAGNOSIS — I350 Nonrheumatic aortic (valve) stenosis: Secondary | ICD-10-CM

## 2011-06-07 LAB — HEPATIC FUNCTION PANEL
ALT: 24 U/L (ref 0–53)
AST: 31 U/L (ref 0–37)
Albumin: 4.2 g/dL (ref 3.5–5.2)
Alkaline Phosphatase: 54 U/L (ref 39–117)
Total Protein: 7.4 g/dL (ref 6.0–8.3)

## 2011-06-07 LAB — LIPID PANEL
Cholesterol: 164 mg/dL (ref 0–200)
Total CHOL/HDL Ratio: 3
Triglycerides: 82 mg/dL (ref 0.0–149.0)

## 2011-06-07 LAB — BASIC METABOLIC PANEL
CO2: 26 mEq/L (ref 19–32)
Calcium: 9.3 mg/dL (ref 8.4–10.5)
Creatinine, Ser: 1.1 mg/dL (ref 0.4–1.5)
GFR: 70.05 mL/min (ref >60.00)

## 2011-06-07 MED ORDER — EZETIMIBE 10 MG PO TABS: 10.0000 mg | ORAL_TABLET | Freq: Every day | ORAL | Status: DC

## 2011-06-07 NOTE — Assessment & Plan Note (Signed)
Blood pressure is elevated today. I recommended he monitor his blood pressure closely at home and keep a diary. If it's consistently over 140 systolic we will need to add additional medication. I've counseled him on sodium restriction.

## 2011-06-07 NOTE — Patient Instructions (Signed)
We will call with the results of your blood work today.  Decrease your sodium intake.  Monitor your blood pressure- if the top reading is consistently over 140 let me know.  I will see you again in 6 months.

## 2011-06-07 NOTE — Assessment & Plan Note (Signed)
We will check fasting lab work today since we switched his statin therapy.

## 2011-06-07 NOTE — Progress Notes (Signed)
Addended by: Alma Friendly on: 06/07/2011 09:08 AM   Modules accepted: Orders

## 2011-06-07 NOTE — Progress Notes (Signed)
Noah Martin Date of Birth: 1930-02-23   History of Present Illness: Noah Martin is seen today for followup. He is doing much better since we switched him from Lipitor to simvastatin. His myalgias have resolved. He continues to exercise and remain very active. He does he have a lot as a result has a fairly high sodium intake. He reports his blood pressure at home is usually in the 120 to 130 systolic range. He denies any chest pain or shortness of breath.  Current Outpatient Prescriptions on File Prior to Visit  Medication Sig Dispense Refill  . aspirin 325 MG tablet Take 325 mg by mouth daily.        . lansoprazole (PREVACID) 30 MG capsule Take 30 mg by mouth daily.        Marland Kitchen lisinopril (PRINIVIL,ZESTRIL) 20 MG tablet TAKE ONE TABLET BY MOUTH DAILY  30 tablet  4  . simvastatin (ZOCOR) 40 MG tablet Take 1 tablet (40 mg total) by mouth every evening.  30 tablet  11    No Known Allergies  Past Medical History  Diagnosis Date  . Hyperlipidemia   . Hypertension   . Aortic stenosis     status post aortic valve replacement  . Peptic ulcer     status post gastrectomy  . Coronary artery disease     status post CABG x1 -- with endo vein to the RCA  . Pulmonary nodules     chronic -- 2008-2009 without change  . Anemia   . Chest pain   . Aortic insufficiency     mild, -- with a markedly dilated aortic root  . Nephrolithiasis   . Acute renal insufficiency     postoperatively  . Atrial fibrillation     Intraoperative and postoperative atrial fibrillation  . Right coronary artery occlusion     Chronic occlusion of right coronary artery    Past Surgical History  Procedure Date  . Cardiac catheterization 05/19/2008    Two-vessel obstructive CAD involving small diagonal branch.  The RCA is occluded and has left-to-right collaterals --  Normal LV function -- Normal pulmonary artery pressures -- Moderate aortic stenosis -- Moderate aortic root enlargement with mild aortic  insufficiency     . Aortic valve replacement 06/04/2008    Aortic valve replacement with a 25 mm model 2700  [employer] pericardial tissue valve, serial number [id number] -- Coronary artery bypass grafting x1 with endovein  . Bilroth ii procedure     anastomosis -- Peptic ulcer surgery  . Finger amputation     post partial amputation of right index finger  . Coronary stent placement 2000    stent placement in the circumflex coronary artery    History  Smoking status  . Former Smoker -- 1.0 packs/day for 14 years  . Types: Cigarettes  . Quit date: 12/04/1972  Smokeless tobacco  . Not on file    History  Alcohol Use No    Family History  Problem Relation Age of Onset  . Coronary artery disease Brother     early onset / with CABG  . Heart attack Mother     Review of Systems: As noted in history of present illness.  All other systems were reviewed and are negative.  Physical Exam: BP 150/80  Pulse 52  Ht 5\' 9"  (1.753 m)  Wt 157 lb (71.215 kg)  BMI 23.18 kg/m2 He is a pleasant white male in no acute distress. He is normocephalic, atraumatic. Pupils are equal  round and reactive to light and accommodation. Extraocular movements are full. He has no JVD or bruits. Carotid upstrokes are normal. Lungs are clear. Cardiac exam reveals a grade 2/6 systolic ejection murmur at the right upper sternal border. Abdomen is soft and nontender. He has no masses or bruits. Extremities are without edema. Pedal pulses are good. Skin is warm and dry. He is alert oriented x3. Cranial nerves II through XII are intact. LABORATORY DATA: ECG today demonstrates sinus bradycardia with first degree AV block. Rate is 52 beats per minute. It is otherwise normal.  Assessment / Plan:

## 2011-06-07 NOTE — Assessment & Plan Note (Signed)
Stable by exam. He is now 2 years status post surgery.

## 2011-09-13 ENCOUNTER — Other Ambulatory Visit: Payer: Self-pay | Admitting: Cardiology

## 2011-10-06 ENCOUNTER — Telehealth: Payer: Self-pay | Admitting: Cardiology

## 2011-10-06 NOTE — Telephone Encounter (Signed)
Patient called stated he has been having pain in shoulders and it is getting worse.States he was reading side effects to simvastatin and zetia and was wanting to stop to see if pain gets better.Message sent to Dr.Jordan for advice.

## 2011-10-06 NOTE — Telephone Encounter (Signed)
Please return call to patient at [phone] to discuss medication.

## 2011-10-06 NOTE — Telephone Encounter (Signed)
He can stop for 3 weeks. If pain improves we may want to rechallenge with Zocor.   Isa Hitz [place] MD, [facility]

## 2011-10-06 NOTE — Telephone Encounter (Signed)
Patient called and told Dr.Jordan advises to stop simvastatin and zetia for 3 weeks.Advised to call back to report symptoms.If pain improves may restart simvastatin.

## 2011-11-06 ENCOUNTER — Other Ambulatory Visit (INDEPENDENT_AMBULATORY_CARE_PROVIDER_SITE_OTHER): Payer: Medicare Other

## 2011-11-06 DIAGNOSIS — E785 Hyperlipidemia, unspecified: Secondary | ICD-10-CM

## 2011-11-06 LAB — BASIC METABOLIC PANEL
CO2: 26 mEq/L (ref 19–32)
Chloride: 107 mEq/L (ref 96–112)
Creatinine, Ser: 1.2 mg/dL (ref 0.4–1.5)

## 2011-11-06 LAB — HEPATIC FUNCTION PANEL
Albumin: 4 g/dL (ref 3.5–5.2)
Alkaline Phosphatase: 47 U/L (ref 39–117)

## 2011-11-06 LAB — LIPID PANEL
LDL Cholesterol: 81 mg/dL (ref 0–99)
Total CHOL/HDL Ratio: 3
Triglycerides: 77 mg/dL (ref 0.0–149.0)

## 2011-12-07 ENCOUNTER — Ambulatory Visit (INDEPENDENT_AMBULATORY_CARE_PROVIDER_SITE_OTHER): Payer: Medicare Other | Admitting: Cardiology

## 2011-12-07 ENCOUNTER — Encounter: Payer: Self-pay | Admitting: Cardiology

## 2011-12-07 VITALS — BP 160/79 | HR 58 | Ht 69.0 in | Wt 156.4 lb

## 2011-12-07 DIAGNOSIS — I2510 Atherosclerotic heart disease of native coronary artery without angina pectoris: Secondary | ICD-10-CM

## 2011-12-07 DIAGNOSIS — I350 Nonrheumatic aortic (valve) stenosis: Secondary | ICD-10-CM

## 2011-12-07 DIAGNOSIS — Z952 Presence of prosthetic heart valve: Secondary | ICD-10-CM

## 2011-12-07 DIAGNOSIS — I10 Essential (primary) hypertension: Secondary | ICD-10-CM

## 2011-12-07 DIAGNOSIS — I359 Nonrheumatic aortic valve disorder, unspecified: Secondary | ICD-10-CM

## 2011-12-07 DIAGNOSIS — Z954 Presence of other heart-valve replacement: Secondary | ICD-10-CM

## 2011-12-07 MED ORDER — AMLODIPINE BESYLATE 5 MG PO TABS: 5.0000 mg | ORAL_TABLET | Freq: Every day | ORAL | Status: DC

## 2011-12-07 NOTE — Assessment & Plan Note (Signed)
He remains asymptomatic. We will continue aspirin and statin therapy.

## 2011-12-07 NOTE — Assessment & Plan Note (Signed)
Patient is status post tissue aortic valve replacement January 2010. He is clinically doing well. I do not see that he has had a followup echocardiogram since his surgery. We'll update that today.

## 2011-12-07 NOTE — Progress Notes (Signed)
Noah Martin Date of Birth: 12-21-1929   History of Present Illness: Noah Martin is seen today for followup. He continues to do very well from a cardiac standpoint. He continues to exercise and remain very active. He does complain of some arthritis pain and when off of his statin for 3 weeks without any improvement. He is back taking it now. Blood pressure at home has been mildly elevated. He denies any chest pain or shortness of breath.  Current Outpatient Prescriptions on File Prior to Visit  Medication Sig Dispense Refill  . aspirin 325 MG tablet Take 325 mg by mouth daily.        Marland Kitchen ezetimibe (ZETIA) 10 MG tablet Take 1 tablet (10 mg total) by mouth daily.  30 tablet  11  . lansoprazole (PREVACID) 30 MG capsule Take 30 mg by mouth daily.        Marland Kitchen lisinopril (PRINIVIL,ZESTRIL) 20 MG tablet TAKE ONE TABLET BY MOUTH DAILY  30 tablet  3  . simvastatin (ZOCOR) 40 MG tablet Take 1 tablet (40 mg total) by mouth every evening.  30 tablet  11  . amLODipine (NORVASC) 5 MG tablet Take 1 tablet (5 mg total) by mouth daily.  180 tablet  3    No Known Allergies  Past Medical History  Diagnosis Date  . Hyperlipidemia   . Hypertension   . Aortic stenosis     status post aortic valve replacement  . Peptic ulcer     status post gastrectomy  . Coronary artery disease     status post CABG x1 -- with endo vein to the RCA  . Pulmonary nodules     chronic -- 2008-2009 without change  . Anemia   . Aortic insufficiency     mild, -- with a markedly dilated aortic root  . Nephrolithiasis   . Acute renal insufficiency     postoperatively  . Atrial fibrillation     Intraoperative and postoperative atrial fibrillation  . Right coronary artery occlusion     Chronic occlusion of right coronary artery    Past Surgical History  Procedure Date  . Cardiac catheterization 05/19/2008    Two-vessel obstructive CAD involving small diagonal branch.  The RCA is occluded and has left-to-right  collaterals --  Normal LV function -- Normal pulmonary artery pressures -- Moderate aortic stenosis -- Moderate aortic root enlargement with mild aortic insufficiency     . Aortic valve replacement 06/04/2008    Aortic valve replacement with a 25 mm model 2700  [employer] pericardial tissue valve, serial number [id number] -- Coronary artery bypass grafting x1 with endovein  . Bilroth ii procedure     anastomosis -- Peptic ulcer surgery  . Finger amputation     post partial amputation of right index finger  . Coronary stent placement 2000    stent placement in the circumflex coronary artery    History  Smoking status  . Former Smoker -- 1.0 packs/day for 14 years  . Types: Cigarettes  . Quit date: 12/04/1972  Smokeless tobacco  . Not on file    History  Alcohol Use No    Family History  Problem Relation Age of Onset  . Coronary artery disease Brother     early onset / with CABG  . Heart attack Mother     Review of Systems: As noted in history of present illness.  All other systems were reviewed and are negative.  Physical Exam: BP 160/79  Pulse 58  Ht 5\' 9"  (1.753 m)  Wt 70.943 kg (156 lb 6.4 oz)  BMI 23.10 kg/m2 He is a pleasant white male in no acute distress. He is normocephalic, atraumatic. Pupils are equal round and reactive to light and accommodation. Extraocular movements are full. He has no JVD or bruits. Carotid upstrokes are normal. Lungs are clear. Cardiac exam reveals a grade 2/6 systolic ejection murmur at the right upper sternal border. Abdomen is soft and nontender. He has no masses or bruits. Extremities are without edema. Pedal pulses are good. Skin is warm and dry. He is alert oriented x3. Cranial nerves II through XII are intact. LABORATORY DATA:   Assessment / Plan:

## 2011-12-07 NOTE — Assessment & Plan Note (Signed)
Blood pressure is poorly controlled today. I do not think he is a good candidate for beta blocker therapy due to to his resting bradycardia. He has had some renal insufficiency in the past so I will avoid HCTZ. We'll add amlodipine 5 mg daily to his current dose of lisinopril.

## 2011-12-07 NOTE — Patient Instructions (Addendum)
We will add amlodipine 5 mg daily for blood pressure.  We will schedule you for an echocardiogram  I will see you again in 6 months.

## 2011-12-08 ENCOUNTER — Ambulatory Visit (HOSPITAL_COMMUNITY): Payer: Medicare Other | Attending: Cardiology | Admitting: Radiology

## 2011-12-08 DIAGNOSIS — Z87891 Personal history of nicotine dependence: Secondary | ICD-10-CM | POA: Insufficient documentation

## 2011-12-08 DIAGNOSIS — I359 Nonrheumatic aortic valve disorder, unspecified: Secondary | ICD-10-CM | POA: Insufficient documentation

## 2011-12-08 DIAGNOSIS — Z952 Presence of prosthetic heart valve: Secondary | ICD-10-CM

## 2011-12-08 DIAGNOSIS — I2510 Atherosclerotic heart disease of native coronary artery without angina pectoris: Secondary | ICD-10-CM

## 2011-12-08 DIAGNOSIS — I059 Rheumatic mitral valve disease, unspecified: Secondary | ICD-10-CM | POA: Insufficient documentation

## 2011-12-08 DIAGNOSIS — I517 Cardiomegaly: Secondary | ICD-10-CM | POA: Insufficient documentation

## 2011-12-08 DIAGNOSIS — R011 Cardiac murmur, unspecified: Secondary | ICD-10-CM | POA: Insufficient documentation

## 2011-12-08 DIAGNOSIS — I10 Essential (primary) hypertension: Secondary | ICD-10-CM

## 2011-12-08 DIAGNOSIS — Z954 Presence of other heart-valve replacement: Secondary | ICD-10-CM | POA: Insufficient documentation

## 2011-12-08 NOTE — Progress Notes (Signed)
Echocardiogram performed.  

## 2011-12-13 ENCOUNTER — Other Ambulatory Visit: Payer: Self-pay | Admitting: Cardiology

## 2011-12-13 ENCOUNTER — Other Ambulatory Visit: Payer: Self-pay

## 2011-12-13 ENCOUNTER — Telehealth: Payer: Self-pay | Admitting: Cardiology

## 2011-12-13 DIAGNOSIS — Z952 Presence of prosthetic heart valve: Secondary | ICD-10-CM

## 2011-12-13 NOTE — Telephone Encounter (Signed)
Patient returning nurse call, he can be reached at [phone]

## 2011-12-13 NOTE — Telephone Encounter (Signed)
Spoke to patient's wife echo results given.Advised needs repeat echo in 1 year.

## 2012-01-12 ENCOUNTER — Other Ambulatory Visit: Payer: Self-pay | Admitting: Cardiology

## 2012-01-12 NOTE — Telephone Encounter (Signed)
Refilled lisinopril

## 2012-04-05 ENCOUNTER — Telehealth: Payer: Self-pay | Admitting: Cardiology

## 2012-04-05 DIAGNOSIS — E785 Hyperlipidemia, unspecified: Secondary | ICD-10-CM

## 2012-04-05 NOTE — Telephone Encounter (Signed)
New problem:   zetia 10 mg .  Tenna Child [phone] fax #   90 days supply with  3 refills

## 2012-04-08 MED ORDER — EZETIMIBE 10 MG PO TABS: 10.0000 mg | ORAL_TABLET | Freq: Every day | ORAL | Status: DC

## 2012-07-05 ENCOUNTER — Other Ambulatory Visit: Payer: Self-pay | Admitting: Cardiology

## 2012-12-05 ENCOUNTER — Other Ambulatory Visit: Payer: Self-pay | Admitting: Cardiology

## 2012-12-12 ENCOUNTER — Ambulatory Visit (HOSPITAL_COMMUNITY): Payer: Medicare Other | Attending: Cardiovascular Disease | Admitting: Radiology

## 2012-12-12 DIAGNOSIS — I2510 Atherosclerotic heart disease of native coronary artery without angina pectoris: Secondary | ICD-10-CM | POA: Insufficient documentation

## 2012-12-12 DIAGNOSIS — I10 Essential (primary) hypertension: Secondary | ICD-10-CM | POA: Insufficient documentation

## 2012-12-12 DIAGNOSIS — E785 Hyperlipidemia, unspecified: Secondary | ICD-10-CM | POA: Insufficient documentation

## 2012-12-12 DIAGNOSIS — I359 Nonrheumatic aortic valve disorder, unspecified: Secondary | ICD-10-CM

## 2012-12-12 DIAGNOSIS — Z952 Presence of prosthetic heart valve: Secondary | ICD-10-CM

## 2012-12-12 DIAGNOSIS — I4891 Unspecified atrial fibrillation: Secondary | ICD-10-CM | POA: Insufficient documentation

## 2012-12-12 NOTE — Progress Notes (Signed)
Echocardiogram performed.  

## 2013-01-03 ENCOUNTER — Other Ambulatory Visit: Payer: Self-pay | Admitting: Cardiology

## 2013-01-03 ENCOUNTER — Other Ambulatory Visit: Payer: Self-pay | Admitting: *Deleted

## 2013-02-11 ENCOUNTER — Encounter: Payer: Self-pay | Admitting: Cardiology

## 2013-02-11 ENCOUNTER — Ambulatory Visit (INDEPENDENT_AMBULATORY_CARE_PROVIDER_SITE_OTHER): Payer: Medicare Other | Admitting: Cardiology

## 2013-02-11 VITALS — BP 169/70 | HR 56 | Wt 155.0 lb

## 2013-02-11 DIAGNOSIS — E785 Hyperlipidemia, unspecified: Secondary | ICD-10-CM

## 2013-02-11 DIAGNOSIS — I10 Essential (primary) hypertension: Secondary | ICD-10-CM

## 2013-02-11 DIAGNOSIS — I350 Nonrheumatic aortic (valve) stenosis: Secondary | ICD-10-CM

## 2013-02-11 DIAGNOSIS — I2510 Atherosclerotic heart disease of native coronary artery without angina pectoris: Secondary | ICD-10-CM

## 2013-02-11 DIAGNOSIS — I359 Nonrheumatic aortic valve disorder, unspecified: Secondary | ICD-10-CM

## 2013-02-11 LAB — BASIC METABOLIC PANEL
CO2: 27 mEq/L (ref 19–32)
Chloride: 104 mEq/L (ref 96–112)
Creatinine, Ser: 1.2 mg/dL (ref 0.4–1.5)
Sodium: 140 mEq/L (ref 135–145)

## 2013-02-11 LAB — LIPID PANEL
HDL: 45.5 mg/dL (ref >39.00)
LDL Cholesterol: 71 mg/dL (ref 0–99)
Total CHOL/HDL Ratio: 3
Triglycerides: 132 mg/dL (ref 0.0–149.0)
VLDL: 26.4 mg/dL (ref 0.0–40.0)

## 2013-02-11 LAB — HEPATIC FUNCTION PANEL: Albumin: 3.8 g/dL (ref 3.5–5.2)

## 2013-02-11 NOTE — Patient Instructions (Signed)
We will check lab work today.  Keep an eye on your blood pressure at home.  I will see you again in one year.

## 2013-02-11 NOTE — Progress Notes (Signed)
Dionne Ano Mandell Date of Birth: 21-Jul-1929   History of Present Illness: Mr. Noah Martin is seen today for followup. He has a history of coronary disease. The right coronary is occluded with left to right collaterals. He has disease in a small diagonal branch. In 2010 he underwent coronary bypass surgery x1 and aortic valve replacement with a pericardial tissue valve for aortic stenosis. He continues to do very well from a cardiac standpoint. He continues to exercise and remain very active. He states that 3 or 4 months ago he quit taking his Norvasc. He states he lost his medication and his blood pressure was running low anyway and he was complaining of feeling lightheaded. He reports his blood pressure readings at home have ranged from 90-110 systolic with normal diastolics.  Current Outpatient Prescriptions on File Prior to Visit  Medication Sig Dispense Refill  . aspirin 325 MG tablet Take 325 mg by mouth daily.        Marland Kitchen ezetimibe (ZETIA) 10 MG tablet Take 1 tablet (10 mg total) by mouth daily.  90 tablet  3  . lansoprazole (PREVACID) 30 MG capsule Take 30 mg by mouth daily.        Marland Kitchen lisinopril (PRINIVIL,ZESTRIL) 20 MG tablet TAKE ONE TABLET BY MOUTH DAILY  30 tablet  2  . simvastatin (ZOCOR) 40 MG tablet TAKE 1 TABLET (40 MG TOTAL) BY MOUTH EVERY EVENING.  30 tablet  10   No current facility-administered medications on file prior to visit.    No Known Allergies  Past Medical History  Diagnosis Date  . Hyperlipidemia   . Hypertension   . Aortic stenosis     status post aortic valve replacement  . Peptic ulcer     status post gastrectomy  . Coronary artery disease     status post CABG x1 -- with endo vein to the RCA  . Pulmonary nodules     chronic -- 2008-2009 without change  . Anemia   . Aortic insufficiency     mild, -- with a markedly dilated aortic root  . Nephrolithiasis   . Acute renal insufficiency     postoperatively  . Atrial fibrillation     Intraoperative and  postoperative atrial fibrillation  . Right coronary artery occlusion     Chronic occlusion of right coronary artery    Past Surgical History  Procedure Laterality Date  . Cardiac catheterization  05/19/2008    Two-vessel obstructive CAD involving small diagonal branch.  The RCA is occluded and has left-to-right collaterals --  Normal LV function -- Normal pulmonary artery pressures -- Moderate aortic stenosis -- Moderate aortic root enlargement with mild aortic insufficiency     . Aortic valve replacement  06/04/2008    Aortic valve replacement with a 25 mm model 2700  [employer] pericardial tissue valve, serial number [id number] -- Coronary artery bypass grafting x1 with endovein  . Bilroth ii procedure      anastomosis -- Peptic ulcer surgery  . Finger amputation      post partial amputation of right index finger  . Coronary stent placement  2000    stent placement in the circumflex coronary artery    History  Smoking status  . Former Smoker -- 1.00 packs/day for 14 years  . Types: Cigarettes  . Quit date: 12/04/1972  Smokeless tobacco  . Not on file    History  Alcohol Use No    Family History  Problem Relation Age of Onset  . Coronary  artery disease Brother     early onset / with CABG  . Heart attack Mother     Review of Systems: As noted in history of present illness.  All other systems were reviewed and are negative.  Physical Exam: BP 169/70  Pulse 56  Wt 155 lb (70.308 kg)  BMI 22.88 kg/m2 He is a pleasant white male in no acute distress. He is normocephalic, atraumatic. Pupils are equal round and reactive to light and accommodation. Extraocular movements are full. He has no JVD or bruits. Carotid upstrokes are normal. Lungs are clear. Cardiac exam reveals a grade 2/6 systolic ejection murmur at the right upper sternal border. Abdomen is soft and nontender. He has no masses or bruits. Extremities are without edema. Pedal pulses are good. Skin is warm and  dry. He is alert oriented x3. Cranial nerves II through XII are intact.  LABORATORY DATA: ECG today demonstrates normal sinus rhythm a rate of 56 beats per minute. He has first degree AV block. There is a mild T-wave abnormality.  Echo 08/14/2014Study Conclusions  - Left ventricle: The cavity size was normal. Wall thickness was increased in a pattern of mild LVH. There was focal basal hypertrophy. Systolic function was normal. The estimated ejection fraction was in the range of 60% to 65%. Wall motion was normal; there were no regional wall motion abnormalities. - Aortic valve: Mild regurgitation. - Left atrium: The atrium was mildly dilated.  ------------------------------------------------------------ Labs, prior tests, procedures, and surgery: Coronary artery bypass grafting.  Echocardiography. M-mode, complete 2D, spectral Doppler, and color Doppler. Height: Height: 175.3cm. Height: 69in. Weight: Weight: 68kg. Weight: 149.7lb. Body mass index: BMI: 22.2kg/m^2. Body surface area: BSA: 1.81m^2. Blood pressure: 160/79. Patient status: Outpatient. Location:  Site 3  ------------------------------------------------------------  ------------------------------------------------------------ Left ventricle: The cavity size was normal. Wall thickness was increased in a pattern of mild LVH. There was focal basal hypertrophy. Systolic function was normal. The estimated ejection fraction was in the range of 60% to 65%. Wall motion was normal; there were no regional wall motion abnormalities.  ------------------------------------------------------------ Aortic valve: Structurally normal valve. Cusp separation was normal. Doppler: Transvalvular velocity was within the normal range. There was no stenosis. Mild regurgitation. VTI ratio of LVOT to aortic valve: 0.28. Peak velocity ratio of LVOT to aortic valve: 0.27. Mean gradient: 21mm Hg (S). Peak gradient: 34mm Hg  (S).  ------------------------------------------------------------ Aorta: Ascending aorta: The ascending aorta was mildly dilated.  ------------------------------------------------------------ Mitral valve: Structurally normal valve. Leaflet separation was normal. Doppler: Transvalvular velocity was within the normal range. There was no evidence for stenosis. No regurgitation. Peak gradient: 4mm Hg (D).  ------------------------------------------------------------ Left atrium: The atrium was mildly dilated.  ------------------------------------------------------------ Right ventricle: The cavity size was normal. Systolic function was normal.  ------------------------------------------------------------ Tricuspid valve: Structurally normal valve. Doppler: Trivial regurgitation.  ------------------------------------------------------------ Pulmonary artery: Systolic pressure was within the normal range.  ------------------------------------------------------------ Right atrium: The atrium was normal in size.  ------------------------------------------------------------ Pericardium: There was no pericardial effusion.  ------------------------------------------------------------  2D measurements Normal Doppler Normal Left ventricle measurements LVID ED, 40.8 mm 43-52 Main pulmonary chord, artery PLAX Pressure, S 29 mm =30 LVID ES, 27.3 mm 23-38 Hg chord, Left ventricle PLAX Ea, lat 14.4 cm/ ------- FS, chord, 33 % >29 ann, tiss s PLAX DP LVPW, ED 10.5 mm ------ E/Ea, lat 6.85 ------- IVS/LVPW 1.22 <1.3 ann, tiss ratio, ED DP Ventricular septum Ea, med 7.9 cm/ ------- IVS, ED 12.8 mm ------ ann, tiss s Aorta DP Root diam, 43 mm ------  E/Ea, med 12.49 ------- ED ann, tiss Left atrium DP AP dim 43 mm ------ LVOT AP dim 2.35 cm/m^2 <2.2 Peak vel, S 80.1 cm/ ------- index s VTI, S 20.7 cm ------- Aortic valve Peak vel, S 292 cm/ ------- s Mean vel, S 216 cm/  ------- s VTI, S 72.8 cm ------- Mean 21 mm ------- gradient, S Hg Peak 34 mm ------- gradient, S Hg VTI ratio 0.28 ------- LVOT/AV Peak vel 0.27 ------- ratio, LVOT/AV Regurg PHT 606 ms ------- Mitral valve Peak E vel 98.7 cm/ ------- s Peak A vel 103 cm/ ------- s Deceleratio 352 ms 150-230 n time Peak 4 mm ------- gradient, D Hg Peak E/A 1 ------- ratio Tricuspid valve Regurg peak 244 cm/ ------- vel s Peak RV-RA 24 mm ------- gradient, S Hg Systemic veins Estimated 5 mm ------- CVP Hg Right ventricle Pressure, S 29 mm <30 Hg Sa vel, lat 12.7 cm/ ------- ann, tiss s DP  ------------------------------------------------------------ Prepared and Electronically Authenticated by  Kristeen Miss [id number]   Assessment / Plan: 1. Status post tissue aortic valve replacement. Valve function is good by echocardiogram. I'll followup again in one year.  2. Coronary disease with occlusion of the right coronary. Status post single vessel bypass surgery 2010. Patient is asymptomatic.  3. Hypertension. Blood pressure is elevated today. He is going to repeat his blood pressure monitoring at home. If it stays elevated I recommend resuming his amlodipine.  4. Hyperlipidemia. We will check fasting lab work today including chemistries and lipid panel.

## 2013-02-14 ENCOUNTER — Ambulatory Visit: Payer: Medicare Other | Admitting: Cardiology

## 2013-02-24 ENCOUNTER — Telehealth: Payer: Self-pay | Admitting: Cardiology

## 2013-02-24 ENCOUNTER — Encounter: Payer: Self-pay | Admitting: Cardiology

## 2013-02-24 NOTE — Telephone Encounter (Signed)
Returned call to patient he stated he restarted Norvasc 5 mg daily last Friday due to B/P being elevated.Stated B/P better ranging 115/59,105,60.Spoke to Dr.Jordan he advised ok to continue.Advised to continue to monitor B/P and call back if needed.

## 2013-02-24 NOTE — Telephone Encounter (Signed)
New   Patient has questions regarding BP meds, If possiable please call before 1pm.

## 2013-03-06 ENCOUNTER — Other Ambulatory Visit: Payer: Self-pay

## 2013-03-06 ENCOUNTER — Other Ambulatory Visit: Payer: Self-pay | Admitting: Cardiology

## 2013-03-18 ENCOUNTER — Other Ambulatory Visit: Payer: Self-pay | Admitting: Cardiology

## 2013-04-01 ENCOUNTER — Other Ambulatory Visit: Payer: Self-pay | Admitting: Cardiology

## 2013-06-10 DIAGNOSIS — H52209 Unspecified astigmatism, unspecified eye: Secondary | ICD-10-CM | POA: Diagnosis not present

## 2013-06-10 DIAGNOSIS — H3530 Unspecified macular degeneration: Secondary | ICD-10-CM | POA: Diagnosis not present

## 2013-06-10 DIAGNOSIS — Z961 Presence of intraocular lens: Secondary | ICD-10-CM | POA: Diagnosis not present

## 2013-06-10 DIAGNOSIS — H04129 Dry eye syndrome of unspecified lacrimal gland: Secondary | ICD-10-CM | POA: Diagnosis not present

## 2013-06-17 DIAGNOSIS — Z23 Encounter for immunization: Secondary | ICD-10-CM | POA: Diagnosis not present

## 2013-06-17 DIAGNOSIS — N529 Male erectile dysfunction, unspecified: Secondary | ICD-10-CM | POA: Diagnosis not present

## 2013-06-17 DIAGNOSIS — L2089 Other atopic dermatitis: Secondary | ICD-10-CM | POA: Diagnosis not present

## 2013-07-26 ENCOUNTER — Other Ambulatory Visit: Payer: Self-pay | Admitting: Cardiology

## 2013-08-23 ENCOUNTER — Other Ambulatory Visit: Payer: Self-pay | Admitting: Cardiology

## 2013-09-10 DIAGNOSIS — L089 Local infection of the skin and subcutaneous tissue, unspecified: Secondary | ICD-10-CM | POA: Diagnosis not present

## 2013-09-16 DIAGNOSIS — Z23 Encounter for immunization: Secondary | ICD-10-CM | POA: Diagnosis not present

## 2013-10-10 ENCOUNTER — Encounter (HOSPITAL_COMMUNITY): Payer: Self-pay | Admitting: Emergency Medicine

## 2013-10-10 ENCOUNTER — Emergency Department (HOSPITAL_COMMUNITY)
Admission: EM | Admit: 2013-10-10 | Discharge: 2013-10-10 | Discharge status: Against Medical Advice | Payer: Medicare Other | Attending: Emergency Medicine | Admitting: Emergency Medicine

## 2013-10-10 DIAGNOSIS — S30860A Insect bite (nonvenomous) of lower back and pelvis, initial encounter: Secondary | ICD-10-CM | POA: Insufficient documentation

## 2013-10-10 DIAGNOSIS — W57XXXA Bitten or stung by nonvenomous insect and other nonvenomous arthropods, initial encounter: Principal | ICD-10-CM | POA: Insufficient documentation

## 2013-10-10 DIAGNOSIS — Z951 Presence of aortocoronary bypass graft: Secondary | ICD-10-CM | POA: Diagnosis not present

## 2013-10-10 DIAGNOSIS — I2510 Atherosclerotic heart disease of native coronary artery without angina pectoris: Secondary | ICD-10-CM | POA: Diagnosis not present

## 2013-10-10 DIAGNOSIS — Z9861 Coronary angioplasty status: Secondary | ICD-10-CM | POA: Diagnosis not present

## 2013-10-10 DIAGNOSIS — I10 Essential (primary) hypertension: Secondary | ICD-10-CM | POA: Insufficient documentation

## 2013-10-10 DIAGNOSIS — Y939 Activity, unspecified: Secondary | ICD-10-CM | POA: Insufficient documentation

## 2013-10-10 DIAGNOSIS — Y9289 Other specified places as the place of occurrence of the external cause: Secondary | ICD-10-CM | POA: Insufficient documentation

## 2013-10-10 NOTE — ED Notes (Signed)
Pt left ama. Pt ambulatory out of ED

## 2013-10-10 NOTE — ED Notes (Signed)
Pt states that he has been in the woods over the last couple days observing logs being cut; pt states that last pm he noticed a "bite" or a "bump" to groin area; pt states that it is red and irritating

## 2013-10-23 ENCOUNTER — Other Ambulatory Visit: Payer: Self-pay | Admitting: Cardiology

## 2013-11-26 ENCOUNTER — Other Ambulatory Visit: Payer: Self-pay

## 2013-11-26 MED ORDER — AMLODIPINE BESYLATE 5 MG PO TABS: 5.0000 mg | ORAL_TABLET | Freq: Every day | ORAL | Status: DC

## 2013-12-20 ENCOUNTER — Other Ambulatory Visit: Payer: Self-pay | Admitting: Cardiology

## 2013-12-25 DIAGNOSIS — Z23 Encounter for immunization: Secondary | ICD-10-CM | POA: Diagnosis not present

## 2014-02-18 ENCOUNTER — Other Ambulatory Visit: Payer: Self-pay | Admitting: Cardiology

## 2014-02-19 ENCOUNTER — Ambulatory Visit (INDEPENDENT_AMBULATORY_CARE_PROVIDER_SITE_OTHER): Payer: Medicare Other | Admitting: Cardiology

## 2014-02-19 ENCOUNTER — Encounter: Payer: Self-pay | Admitting: Cardiology

## 2014-02-19 VITALS — BP 154/72 | HR 51 | Ht 69.0 in | Wt 159.9 lb

## 2014-02-19 DIAGNOSIS — I350 Nonrheumatic aortic (valve) stenosis: Secondary | ICD-10-CM | POA: Diagnosis not present

## 2014-02-19 DIAGNOSIS — I10 Essential (primary) hypertension: Secondary | ICD-10-CM

## 2014-02-19 DIAGNOSIS — Z952 Presence of prosthetic heart valve: Secondary | ICD-10-CM | POA: Diagnosis not present

## 2014-02-19 DIAGNOSIS — Z954 Presence of other heart-valve replacement: Secondary | ICD-10-CM

## 2014-02-19 DIAGNOSIS — I359 Nonrheumatic aortic valve disorder, unspecified: Secondary | ICD-10-CM | POA: Diagnosis not present

## 2014-02-19 DIAGNOSIS — I2510 Atherosclerotic heart disease of native coronary artery without angina pectoris: Secondary | ICD-10-CM | POA: Diagnosis not present

## 2014-02-19 LAB — CBC WITH DIFFERENTIAL/PLATELET
Basophils Absolute: 0.1 10*3/uL (ref 0.0–0.1)
Basophils Relative: 1 % (ref 0–1)
EOS PCT: 5 % (ref 0–5)
Eosinophils Absolute: 0.3 10*3/uL (ref 0.0–0.7)
HCT: 38.8 % — ABNORMAL LOW (ref 39.0–52.0)
HEMOGLOBIN: 12.8 g/dL — AB (ref 13.0–17.0)
LYMPHS ABS: 1.2 10*3/uL (ref 0.7–4.0)
LYMPHS PCT: 24 % (ref 12–46)
MCH: 27.8 pg (ref 26.0–34.0)
MCHC: 33 g/dL (ref 30.0–36.0)
MCV: 84.3 fL (ref 78.0–100.0)
Monocytes Absolute: 0.6 10*3/uL (ref 0.1–1.0)
Monocytes Relative: 12 % (ref 3–12)
Neutro Abs: 3 10*3/uL (ref 1.7–7.7)
Neutrophils Relative %: 58 % (ref 43–77)
PLATELETS: 187 10*3/uL (ref 150–400)
RBC: 4.6 MIL/uL (ref 4.22–5.81)
RDW: 14.1 % (ref 11.5–15.5)
WBC: 5.2 10*3/uL (ref 4.0–10.5)

## 2014-02-19 LAB — BASIC METABOLIC PANEL
BUN: 19 mg/dL (ref 6–23)
CALCIUM: 9.2 mg/dL (ref 8.4–10.5)
CO2: 23 mEq/L (ref 19–32)
CREATININE: 1.29 mg/dL (ref 0.50–1.35)
Chloride: 106 mEq/L (ref 96–112)
Glucose, Bld: 86 mg/dL (ref 70–99)
Potassium: 4.4 mEq/L (ref 3.5–5.3)
Sodium: 140 mEq/L (ref 135–145)

## 2014-02-19 LAB — HEPATIC FUNCTION PANEL
ALT: 22 U/L (ref 0–53)
AST: 29 U/L (ref 0–37)
Albumin: 4.2 g/dL (ref 3.5–5.2)
Alkaline Phosphatase: 57 U/L (ref 39–117)
BILIRUBIN DIRECT: 0.1 mg/dL (ref 0.0–0.3)
BILIRUBIN INDIRECT: 0.5 mg/dL (ref 0.2–1.2)
BILIRUBIN TOTAL: 0.6 mg/dL (ref 0.2–1.2)
Total Protein: 6.7 g/dL (ref 6.0–8.3)

## 2014-02-19 LAB — LIPID PANEL
Cholesterol: 129 mg/dL (ref 0–200)
HDL: 50 mg/dL (ref >39)
LDL Cholesterol: 56 mg/dL (ref 0–99)
TRIGLYCERIDES: 115 mg/dL (ref <150)
Total CHOL/HDL Ratio: 2.6 Ratio
VLDL: 23 mg/dL (ref 0–40)

## 2014-02-19 NOTE — Progress Notes (Signed)
Noah Martin Date of Birth: 1929/10/30   History of Present Illness: Noah Martin is seen today for followup. He has a history of coronary disease. The right coronary is occluded with left to right collaterals. He has disease in a small diagonal branch. In 2010 he underwent coronary bypass surgery x1 and aortic valve replacement with a pericardial tissue valve for aortic stenosis. Echo in 8/14 showed normal LV function and normal valve function. He continues to do very well from a cardiac standpoint. He remains active. His BP at home has been OK. Was 141/76 this am. Denies any chest pain or SOB.  Current Outpatient Prescriptions on File Prior to Visit  Medication Sig Dispense Refill  . amLODipine (NORVASC) 5 MG tablet Take 1 tablet (5 mg total) by mouth daily.  30 tablet  1  . aspirin EC 325 MG tablet Take 325 mg by mouth daily.      Marland Kitchen ezetimibe (ZETIA) 10 MG tablet Take 10 mg by mouth daily.      . lansoprazole (PREVACID) 30 MG capsule Take 30 mg by mouth daily.        Marland Kitchen lisinopril (PRINIVIL,ZESTRIL) 20 MG tablet Take 20 mg by mouth daily.      . simvastatin (ZOCOR) 40 MG tablet TAKE 1 TABLET (40 MG TOTAL) BY MOUTH EVERY EVENING.  30 tablet  1   No current facility-administered medications on file prior to visit.    No Known Allergies  Past Medical History  Diagnosis Date  . Hyperlipidemia   . Hypertension   . Aortic stenosis     status post aortic valve replacement  . Peptic ulcer     status post gastrectomy  . Coronary artery disease     status post CABG x1 -- with endo vein to the RCA  . Pulmonary nodules     chronic -- 2008-2009 without change  . Anemia   . Aortic insufficiency     mild, -- with a markedly dilated aortic root  . Nephrolithiasis   . Acute renal insufficiency     postoperatively  . Atrial fibrillation     Intraoperative and postoperative atrial fibrillation  . Right coronary artery occlusion     Chronic occlusion of right coronary artery     Past Surgical History  Procedure Laterality Date  . Cardiac catheterization  05/19/2008    Two-vessel obstructive CAD involving small diagonal branch.  The RCA is occluded and has left-to-right collaterals --  Normal LV function -- Normal pulmonary artery pressures -- Moderate aortic stenosis -- Moderate aortic root enlargement with mild aortic insufficiency     . Aortic valve replacement  06/04/2008    Aortic valve replacement with a 25 mm model [place] pericardial tissue valve, serial number [id number] -- Coronary artery bypass grafting x1 with endovein  . Bilroth ii procedure      anastomosis -- Peptic ulcer surgery  . Finger amputation      post partial amputation of right index finger  . Coronary stent placement  2000    stent placement in the circumflex coronary artery    History  Smoking status  . Former Smoker -- 1.00 packs/day for 14 years  . Types: Cigarettes  . Quit date: 12/04/1972  Smokeless tobacco  . Not on file    History  Alcohol Use No    Family History  Problem Relation Age of Onset  . Coronary artery disease Brother     early onset / with CABG  .  Heart attack Mother     Review of Systems: As noted in history of present illness.  All other systems were reviewed and are negative.  Physical Exam: BP 154/72  Pulse 51  Ht 5\' 9"  (1.753 m)  Wt 159 lb 14.4 oz (72.53 kg)  BMI 23.60 kg/m2 He is a pleasant white male in no acute distress. HEENT is unremarkable.  He has no JVD or bruits. Carotid upstrokes are normal. Lungs are clear. Cardiac exam reveals a grade 4-5/9 systolic ejection murmur at the right upper sternal border. Abdomen is soft and nontender. He has no masses or bruits. Extremities are without edema. Pedal pulses are good. Skin is warm and dry. He is alert oriented x3. Cranial nerves II through XII are intact.  LABORATORY DATA: ECG today demonstrates normal sinus rhythm a rate of 51 beats per minute. He has first degree AV  block. It is otherwise normal.   Assessment / Plan: 1. Status post tissue aortic valve replacement. Valve function is good by echocardiogram. I'll followup again in one year.  2. Coronary disease with occlusion of the right coronary. Status post single vessel bypass surgery 2010. Patient is asymptomatic.  3. Hypertension. Blood pressure is under fair control. Continue amlodipine and lisinopril.  4. Hyperlipidemia. We will check fasting lab work today including chemistries and lipid panel. On statin therapy.

## 2014-02-19 NOTE — Addendum Note (Signed)
Addended by: Golden Hurter D on: 02/19/2014 09:21 AM   Modules accepted: Orders

## 2014-02-19 NOTE — Patient Instructions (Signed)
We will check fasting lab work today  I will see you in one year

## 2014-03-19 ENCOUNTER — Other Ambulatory Visit: Payer: Self-pay | Admitting: Cardiology

## 2014-03-31 ENCOUNTER — Other Ambulatory Visit: Payer: Self-pay | Admitting: Cardiology

## 2014-04-01 DIAGNOSIS — J209 Acute bronchitis, unspecified: Secondary | ICD-10-CM | POA: Diagnosis not present

## 2014-04-01 NOTE — Telephone Encounter (Signed)
Rx refill sent to patient pharmacy   

## 2014-04-16 ENCOUNTER — Other Ambulatory Visit: Payer: Self-pay | Admitting: Cardiology

## 2014-07-20 DIAGNOSIS — L03116 Cellulitis of left lower limb: Secondary | ICD-10-CM | POA: Diagnosis not present

## 2014-10-07 DIAGNOSIS — K648 Other hemorrhoids: Secondary | ICD-10-CM | POA: Diagnosis not present

## 2014-10-21 DIAGNOSIS — K648 Other hemorrhoids: Secondary | ICD-10-CM | POA: Diagnosis not present

## 2014-11-11 DIAGNOSIS — K648 Other hemorrhoids: Secondary | ICD-10-CM | POA: Diagnosis not present

## 2014-11-19 DIAGNOSIS — K219 Gastro-esophageal reflux disease without esophagitis: Secondary | ICD-10-CM | POA: Diagnosis not present

## 2014-11-19 DIAGNOSIS — E780 Pure hypercholesterolemia: Secondary | ICD-10-CM | POA: Diagnosis not present

## 2014-11-19 DIAGNOSIS — L309 Dermatitis, unspecified: Secondary | ICD-10-CM | POA: Diagnosis not present

## 2014-11-19 DIAGNOSIS — I10 Essential (primary) hypertension: Secondary | ICD-10-CM | POA: Diagnosis not present

## 2014-11-19 DIAGNOSIS — L290 Pruritus ani: Secondary | ICD-10-CM | POA: Diagnosis not present

## 2015-01-26 DIAGNOSIS — Z23 Encounter for immunization: Secondary | ICD-10-CM | POA: Diagnosis not present

## 2015-02-12 ENCOUNTER — Other Ambulatory Visit: Payer: Self-pay | Admitting: Cardiology

## 2015-02-12 ENCOUNTER — Other Ambulatory Visit: Payer: Self-pay

## 2015-02-12 MED ORDER — LISINOPRIL 20 MG PO TABS: 20.0000 mg | ORAL_TABLET | Freq: Every day | ORAL | Status: DC

## 2015-03-12 ENCOUNTER — Encounter: Payer: Self-pay | Admitting: Cardiology

## 2015-03-12 ENCOUNTER — Ambulatory Visit (INDEPENDENT_AMBULATORY_CARE_PROVIDER_SITE_OTHER): Payer: Medicare Other | Admitting: Cardiology

## 2015-03-12 VITALS — BP 150/72 | HR 50 | Ht 72.0 in | Wt 157.0 lb

## 2015-03-12 DIAGNOSIS — I2510 Atherosclerotic heart disease of native coronary artery without angina pectoris: Secondary | ICD-10-CM

## 2015-03-12 DIAGNOSIS — I10 Essential (primary) hypertension: Secondary | ICD-10-CM

## 2015-03-12 DIAGNOSIS — I350 Nonrheumatic aortic (valve) stenosis: Secondary | ICD-10-CM | POA: Diagnosis not present

## 2015-03-12 DIAGNOSIS — Z954 Presence of other heart-valve replacement: Secondary | ICD-10-CM

## 2015-03-12 DIAGNOSIS — E785 Hyperlipidemia, unspecified: Secondary | ICD-10-CM | POA: Diagnosis not present

## 2015-03-12 DIAGNOSIS — Z952 Presence of prosthetic heart valve: Secondary | ICD-10-CM

## 2015-03-12 NOTE — Patient Instructions (Signed)
Continue your current therapy  I will see you in one year   

## 2015-03-12 NOTE — Progress Notes (Signed)
Satira Anis Spychalski Date of Birth: [birth year]   History of Present Illness: Mr. Wehmeyer is seen today for followup of CAD/AVR. He has a history of coronary disease. The right coronary is occluded with left to right collaterals. He has disease in a small diagonal branch. In 2010 he underwent coronary bypass surgery x1 and aortic valve replacement with a pericardial tissue valve for aortic stenosis. Echo in 8/14 showed normal LV function and normal valve function.  On follow up today he continues to do very well from a cardiac standpoint. He remains active.  Denies any chest pain or SOB. Labs followed by primary care.  Current Outpatient Prescriptions on File Prior to Visit  Medication Sig Dispense Refill  . amLODipine (NORVASC) 5 MG tablet TAKE 1 TABLET (5 MG TOTAL) BY MOUTH DAILY. 30 tablet 11  . aspirin EC 325 MG tablet Take 325 mg by mouth daily.    . lansoprazole (PREVACID) 30 MG capsule Take 30 mg by mouth daily.      Marland Kitchen lisinopril (PRINIVIL,ZESTRIL) 20 MG tablet Take 1 tablet (20 mg total) by mouth daily. 30 tablet 10  . simvastatin (ZOCOR) 40 MG tablet TAKE 1 TABLET (40 MG TOTAL) BY MOUTH EVERY EVENING. 30 tablet 11  . ZETIA 10 MG tablet TAKE 1 TABLET DAILY 90 tablet 3   No current facility-administered medications on file prior to visit.    No Known Allergies  Past Medical History  Diagnosis Date  . Hyperlipidemia   . Hypertension   . Aortic stenosis     status post aortic valve replacement  . Peptic ulcer     status post gastrectomy  . Coronary artery disease     status post CABG x1 -- with endo vein to the RCA  . Pulmonary nodules     chronic -- 2008-2009 without change  . Anemia   . Aortic insufficiency     mild, -- with a markedly dilated aortic root  . Nephrolithiasis   . Acute renal insufficiency     postoperatively  . Atrial fibrillation ([place])     Intraoperative and postoperative atrial fibrillation  . Right coronary artery occlusion [facility])     Chronic  occlusion of right coronary artery    Past Surgical History  Procedure Laterality Date  . Cardiac catheterization  05/19/2008    Two-vessel obstructive CAD involving small diagonal branch.  The RCA is occluded and has left-to-right collaterals --  Normal LV function -- Normal pulmonary artery pressures -- Moderate aortic stenosis -- Moderate aortic root enlargement with mild aortic insufficiency     . Aortic valve replacement  06/04/2008    Aortic valve replacement with a 25 mm model [place] pericardial tissue valve, serial number [id number] -- Coronary artery bypass grafting x1 with endovein  . Bilroth ii procedure      anastomosis -- Peptic ulcer surgery  . Finger amputation      post partial amputation of right index finger  . Coronary stent placement  2000    stent placement in the circumflex coronary artery    History  Smoking status  . Former Smoker -- 1.00 packs/day for 14 years  . Types: Cigarettes  . Quit date: 12/04/1972  Smokeless tobacco  . Not on file    History  Alcohol Use No    Family History  Problem Relation Age of Onset  . Coronary artery disease Brother     early onset / with CABG  . Heart attack Mother  Review of Systems: As noted in history of present illness.  All other systems were reviewed and are negative.  Physical Exam: BP 150/72 mmHg  Pulse 50  Ht 6' (1.829 m)  Wt 71.215 kg (157 lb)  BMI 21.29 kg/m2 He is a pleasant white male in no acute distress. HEENT is unremarkable.  He has no JVD or bruits. Carotid upstrokes are normal. Lungs are clear. Cardiac exam reveals a grade 1/6 systolic ejection murmur at the right upper sternal border and apex. Abdomen is soft and nontender. He has no masses or bruits. Extremities are without edema. Pedal pulses are good. Skin is warm and dry. He is alert oriented x3. Cranial nerves II through XII are intact.  LABORATORY DATA: ECG today demonstrates normal sinus rhythm a rate of 50 beats  per minute. He has first degree AV block. It is otherwise normal. I have personally reviewed and interpreted this study.  Echo 12/13/14: Study Conclusions  - Left ventricle: The cavity size was normal. Wall thickness was increased in a pattern of mild LVH. There was focal basal hypertrophy. Systolic function was normal. The estimated ejection fraction was in the range of 60% to 65%. Wall motion was normal; there were no regional wall motion abnormalities. - Aortic valve: Mild regurgitation. - Left atrium: The atrium was mildly dilated.  Assessment / Plan: 1. Status post tissue aortic valve replacement. Valve function was good by echocardiogram in 2014. Exam is stable.  2. Coronary disease with occlusion of the right coronary. Status post single vessel bypass surgery 2010. Patient is asymptomatic.  3. Hypertension. Blood pressure is under fair control. Continue amlodipine and lisinopril.  4. Hyperlipidemia. On Zocor and Zetia. Labs followed by Dr. Maceo Pro.

## 2015-03-17 ENCOUNTER — Other Ambulatory Visit: Payer: Self-pay | Admitting: Cardiology

## 2015-03-17 NOTE — Telephone Encounter (Signed)
Rx request sent to pharmacy.  

## 2015-03-22 ENCOUNTER — Telehealth: Payer: Self-pay | Admitting: Cardiology

## 2015-03-22 DIAGNOSIS — I350 Nonrheumatic aortic (valve) stenosis: Secondary | ICD-10-CM

## 2015-03-22 DIAGNOSIS — E785 Hyperlipidemia, unspecified: Secondary | ICD-10-CM

## 2015-03-22 DIAGNOSIS — Z952 Presence of prosthetic heart valve: Secondary | ICD-10-CM

## 2015-03-22 NOTE — Telephone Encounter (Signed)
Received a call from [facility] with Dr.Fried's office.Stated they received Dr.Jordan's last office note and noticed Dr.Jordan mentioned lipids followed by Dr.Fried.Stated patient tells them Dr.Jordan follows lipids, last lipid panel 2012.Advised I will let Dr.Jordan know.

## 2015-03-23 NOTE — Telephone Encounter (Signed)
Spoke to patient.Advised will need fasting lab work,no lab done at [place] office since 2012.Lab orders mailed.Stated he will have done last week of this month.

## 2015-03-23 NOTE — Addendum Note (Signed)
Addended by: Golden Hurter D on: 03/23/2015 03:00 PM   Modules accepted: Orders

## 2015-04-02 DIAGNOSIS — I350 Nonrheumatic aortic (valve) stenosis: Secondary | ICD-10-CM | POA: Diagnosis not present

## 2015-04-02 DIAGNOSIS — Z954 Presence of other heart-valve replacement: Secondary | ICD-10-CM | POA: Diagnosis not present

## 2015-04-02 DIAGNOSIS — I359 Nonrheumatic aortic valve disorder, unspecified: Secondary | ICD-10-CM | POA: Diagnosis not present

## 2015-04-02 DIAGNOSIS — E785 Hyperlipidemia, unspecified: Secondary | ICD-10-CM | POA: Diagnosis not present

## 2015-04-03 LAB — HEPATIC FUNCTION PANEL
ALBUMIN: 3.9 g/dL (ref 3.6–5.1)
ALT: 16 U/L (ref 9–46)
AST: 22 U/L (ref 10–35)
Alkaline Phosphatase: 55 U/L (ref 40–115)
BILIRUBIN DIRECT: 0.2 mg/dL (ref <=0.2)
BILIRUBIN TOTAL: 0.7 mg/dL (ref 0.2–1.2)
Indirect Bilirubin: 0.5 mg/dL (ref 0.2–1.2)
Total Protein: 6.8 g/dL (ref 6.1–8.1)

## 2015-04-03 LAB — BASIC METABOLIC PANEL
BUN: 18 mg/dL (ref 7–25)
CHLORIDE: 107 mmol/L (ref 98–110)
CO2: 26 mmol/L (ref 20–31)
Calcium: 9.1 mg/dL (ref 8.6–10.3)
Creat: 1.19 mg/dL — ABNORMAL HIGH (ref 0.70–1.11)
GLUCOSE: 104 mg/dL — AB (ref 65–99)
POTASSIUM: 4.6 mmol/L (ref 3.5–5.3)
SODIUM: 141 mmol/L (ref 135–146)

## 2015-04-03 LAB — LIPID PANEL
CHOL/HDL RATIO: 2.6 ratio (ref <=5.0)
Cholesterol: 125 mg/dL (ref 125–200)
HDL: 49 mg/dL (ref >=40)
LDL CALC: 54 mg/dL (ref <130)
Triglycerides: 110 mg/dL (ref <150)
VLDL: 22 mg/dL (ref <30)

## 2015-05-03 ENCOUNTER — Other Ambulatory Visit: Payer: Self-pay | Admitting: Cardiology

## 2015-05-14 DIAGNOSIS — L309 Dermatitis, unspecified: Secondary | ICD-10-CM | POA: Diagnosis not present

## 2015-05-14 DIAGNOSIS — Z23 Encounter for immunization: Secondary | ICD-10-CM | POA: Diagnosis not present

## 2015-05-14 DIAGNOSIS — K219 Gastro-esophageal reflux disease without esophagitis: Secondary | ICD-10-CM | POA: Diagnosis not present

## 2015-07-16 DIAGNOSIS — E785 Hyperlipidemia, unspecified: Secondary | ICD-10-CM | POA: Diagnosis not present

## 2015-07-16 DIAGNOSIS — I10 Essential (primary) hypertension: Secondary | ICD-10-CM | POA: Diagnosis not present

## 2015-07-16 DIAGNOSIS — L309 Dermatitis, unspecified: Secondary | ICD-10-CM | POA: Diagnosis not present

## 2016-01-05 DIAGNOSIS — Z23 Encounter for immunization: Secondary | ICD-10-CM | POA: Diagnosis not present

## 2016-02-09 ENCOUNTER — Other Ambulatory Visit: Payer: Self-pay | Admitting: Cardiology

## 2016-03-05 ENCOUNTER — Other Ambulatory Visit: Payer: Self-pay | Admitting: Cardiology

## 2016-03-06 NOTE — Telephone Encounter (Signed)
Rx request sent to pharmacy.  

## 2016-03-09 ENCOUNTER — Other Ambulatory Visit: Payer: Self-pay | Admitting: Cardiology

## 2016-03-09 DIAGNOSIS — Z85828 Personal history of other malignant neoplasm of skin: Secondary | ICD-10-CM | POA: Diagnosis not present

## 2016-03-09 DIAGNOSIS — L821 Other seborrheic keratosis: Secondary | ICD-10-CM | POA: Diagnosis not present

## 2016-03-09 DIAGNOSIS — Z23 Encounter for immunization: Secondary | ICD-10-CM | POA: Diagnosis not present

## 2016-03-09 DIAGNOSIS — L570 Actinic keratosis: Secondary | ICD-10-CM | POA: Diagnosis not present

## 2016-04-23 NOTE — Progress Notes (Deleted)
Noah Martin Date of Birth: 12-20-1929   History of Present Illness: Noah Martin is seen today for followup of CAD/AVR. He has a history of coronary disease. The right coronary is occluded with left to right collaterals. He has disease in a small diagonal branch. In 2010 he underwent coronary bypass surgery x1 and aortic valve replacement with a pericardial tissue valve for aortic stenosis. Echo in 8/14 showed normal LV function and normal valve function.   On follow up today he continues to do very well from a cardiac standpoint. He remains active.  Denies any chest pain or SOB. Labs followed by primary care.  Current Outpatient Prescriptions on File Prior to Visit  Medication Sig Dispense Refill  . amLODipine (NORVASC) 5 MG tablet Take 1 tablet (5 mg total) by mouth daily. 30 tablet 1  . aspirin EC 325 MG tablet Take 325 mg by mouth daily.    Marland Kitchen ezetimibe (ZETIA) 10 MG tablet TAKE 1 TABLET DAILY 90 tablet 3  . lansoprazole (PREVACID) 30 MG capsule Take 30 mg by mouth daily.      Marland Kitchen lisinopril (PRINIVIL,ZESTRIL) 20 MG tablet Take 1 tablet (20 mg total) by mouth daily. 30 tablet 1  . simvastatin (ZOCOR) 40 MG tablet Take 1 tablet (40 mg total) by mouth every evening. 30 tablet 1   No current facility-administered medications on file prior to visit.     No Known Allergies  Past Medical History:  Diagnosis Date  . Acute renal insufficiency    postoperatively  . Anemia   . Aortic insufficiency    mild, -- with a markedly dilated aortic root  . Aortic stenosis    status post aortic valve replacement  . Atrial fibrillation ([place])    Intraoperative and postoperative atrial fibrillation  . Coronary artery disease    status post CABG x1 -- with endo vein to the RCA  . Hyperlipidemia   . Hypertension   . Nephrolithiasis   . Peptic ulcer    status post gastrectomy  . Pulmonary nodules    chronic -- 2008-2009 without change  . Right coronary artery occlusion [facility])    Chronic  occlusion of right coronary artery    Past Surgical History:  Procedure Laterality Date  . AORTIC VALVE REPLACEMENT  06/04/2008   Aortic valve replacement with a 25 mm model [place] pericardial tissue valve, serial number [id number] -- Coronary artery bypass grafting x1 with endovein  . BILROTH II PROCEDURE     anastomosis -- Peptic ulcer surgery  . CARDIAC CATHETERIZATION  05/19/2008   Two-vessel obstructive CAD involving small diagonal branch.  The RCA is occluded and has left-to-right collaterals --  Normal LV function -- Normal pulmonary artery pressures -- Moderate aortic stenosis -- Moderate aortic root enlargement with mild aortic insufficiency     . CORONARY STENT PLACEMENT  2000   stent placement in the circumflex coronary artery  . FINGER AMPUTATION     post partial amputation of right index finger    History  Smoking Status  . Former Smoker  . Packs/day: 1.00  . Years: 14.00  . Types: Cigarettes  . Quit date: 12/04/1972  Smokeless Tobacco  . Not on file    History  Alcohol Use No    Family History  Problem Relation Age of Onset  . Heart attack Mother   . Coronary artery disease Brother     early onset / with CABG    Review of Systems: As noted  in history of present illness.  All other systems were reviewed and are negative.  Physical Exam: There were no vitals taken for this visit. He is a pleasant white male in no acute distress. HEENT is unremarkable.  He has no JVD or bruits. Carotid upstrokes are normal. Lungs are clear. Cardiac exam reveals a grade 1/6 systolic ejection murmur at the right upper sternal border and apex. Abdomen is soft and nontender. He has no masses or bruits. Extremities are without edema. Pedal pulses are good. Skin is warm and dry. He is alert oriented x3. Cranial nerves II through XII are intact.  LABORATORY DATA:  Dated 04/02/15; cholesterol 125, triglycerides 110, LDL 54, HDL 49. BUN 18, creatinine 1.19. Other CMET  normal.  ECG today demonstrates normal sinus rhythm a rate of 50 beats per minute. He has first degree AV block. It is otherwise normal. I have personally reviewed and interpreted this study.  Echo 12/12/12: Study Conclusions  - Left ventricle: The cavity size was normal. Wall thickness was increased in a pattern of mild LVH. There was focal basal hypertrophy. Systolic function was normal. The estimated ejection fraction was in the range of 60% to 65%. Wall motion was normal; there were no regional wall motion abnormalities. - Aortic valve: Mild regurgitation. - Left atrium: The atrium was mildly dilated.  Assessment / Plan: 1. Status post tissue aortic valve replacement. Valve function was good by echocardiogram in 2014. Exam is stable.  2. Coronary disease with occlusion of the right coronary. Status post single vessel bypass surgery 2010. Patient is asymptomatic.  3. Hypertension. Blood pressure is under fair control. Continue amlodipine and lisinopril.  4. Hyperlipidemia. On Zocor and Zetia. Labs followed by Dr. Maceo Pro.

## 2016-04-27 ENCOUNTER — Ambulatory Visit: Payer: Medicare Other | Admitting: Cardiology

## 2016-05-01 NOTE — Progress Notes (Signed)
Noah Martin Date of Birth: [birth year]   History of Present Illness: Noah Martin is seen today for followup of CAD/AVR. He has a history of coronary disease. The right coronary is occluded with left to right collaterals. He has disease in a small diagonal branch. In 2010 he underwent coronary bypass surgery x1 and aortic valve replacement with a pericardial tissue valve for aortic stenosis. Echo in 8/14 showed normal LV function and normal valve function.  On follow up today he continues to do very well from a cardiac standpoint. He remains active. He walks at least a mile/day. Denies any chest pain or SOB.   Current Outpatient Prescriptions on File Prior to Visit  Medication Sig Dispense Refill  . amLODipine (NORVASC) 5 MG tablet Take 1 tablet (5 mg total) by mouth daily. 30 tablet 1  . aspirin EC 325 MG tablet Take 325 mg by mouth daily.    Marland Kitchen ezetimibe (ZETIA) 10 MG tablet TAKE 1 TABLET DAILY 90 tablet 3  . lansoprazole (PREVACID) 30 MG capsule Take 30 mg by mouth daily.      Marland Kitchen lisinopril (PRINIVIL,ZESTRIL) 20 MG tablet Take 1 tablet (20 mg total) by mouth daily. 30 tablet 1  . simvastatin (ZOCOR) 40 MG tablet Take 1 tablet (40 mg total) by mouth every evening. 30 tablet 1   No current facility-administered medications on file prior to visit.     No Known Allergies  Past Medical History:  Diagnosis Date  . Acute renal insufficiency    postoperatively  . Anemia   . Aortic insufficiency    mild, -- with a markedly dilated aortic root  . Aortic stenosis    status post aortic valve replacement  . Atrial fibrillation ([place])    Intraoperative and postoperative atrial fibrillation  . Coronary artery disease    status post CABG x1 -- with endo vein to the RCA  . Hyperlipidemia   . Hypertension   . Nephrolithiasis   . Peptic ulcer    status post gastrectomy  . Pulmonary nodules    chronic -- 2008-2009 without change  . Right coronary artery occlusion [facility])    Chronic  occlusion of right coronary artery    Past Surgical History:  Procedure Laterality Date  . AORTIC VALVE REPLACEMENT  06/04/2008   Aortic valve replacement with a 25 mm model [place] pericardial tissue valve, serial number [id number] -- Coronary artery bypass grafting x1 with endovein  . BILROTH II PROCEDURE     anastomosis -- Peptic ulcer surgery  . CARDIAC CATHETERIZATION  05/19/2008   Two-vessel obstructive CAD involving small diagonal branch.  The RCA is occluded and has left-to-right collaterals --  Normal LV function -- Normal pulmonary artery pressures -- Moderate aortic stenosis -- Moderate aortic root enlargement with mild aortic insufficiency     . CORONARY STENT PLACEMENT  2000   stent placement in the circumflex coronary artery  . FINGER AMPUTATION     post partial amputation of right index finger    History  Smoking Status  . Former Smoker  . Packs/day: 1.00  . Years: 14.00  . Types: Cigarettes  . Quit date: 12/04/1972  Smokeless Tobacco  . Never Used    History  Alcohol Use No    Family History  Problem Relation Age of Onset  . Heart attack Mother   . Coronary artery disease Brother     early onset / with CABG    Review of Systems: As noted in  history of present illness.  All other systems were reviewed and are negative.  Physical Exam: BP 120/62   Pulse 61   Ht '5\' 9"'$  (1.753 m)   Wt 154 lb (69.9 kg)   BMI 22.74 kg/m  He is a pleasant white male in no acute distress. HEENT is unremarkable.  He has no JVD or bruits. Carotid upstrokes are normal. Lungs are clear. Cardiac exam reveals a grade 1/6 systolic ejection murmur at the right upper sternal border and apex. Abdomen is soft and nontender. He has no masses or bruits. Extremities are without edema. Pedal pulses are good. Skin is warm and dry. He is alert oriented x3. Cranial nerves II through XII are intact.  LABORATORY DATA: Lab Results  Component Value Date   WBC 5.2 02/19/2014   HGB  12.8 (L) 02/19/2014   HCT 38.8 (L) 02/19/2014   PLT 187 02/19/2014   GLUCOSE 104 (H) 04/02/2015   CHOL 125 04/02/2015   TRIG 110 04/02/2015   HDL 49 04/02/2015   LDLCALC 54 04/02/2015   ALT 16 04/02/2015   AST 22 04/02/2015   NA 141 04/02/2015   K 4.6 04/02/2015   CL 107 04/02/2015   CREATININE 1.19 (H) 04/02/2015   BUN 18 04/02/2015   CO2 26 04/02/2015   INR 1.7 (H) 06/03/2008   HGBA1C  05/29/2008    5.8 (NOTE)   The ADA recommends the following therapeutic goal for glycemic   control related to Hgb A1C measurement:   Goal of Therapy:   < 7.0% Hgb A1C   Reference: American Diabetes Association: Clinical Practice   Recommendations 2008, Diabetes Care,  2008, 31:(Suppl 1).    ECG today demonstrates normal sinus rhythm a rate of 61 beats per minute. He has first degree AV block. It is otherwise normal. I have personally reviewed and interpreted this study.  Echo 12/13/14: Study Conclusions  - Left ventricle: The cavity size was normal. Wall thickness was increased in a pattern of mild LVH. There was focal basal hypertrophy. Systolic function was normal. The estimated ejection fraction was in the range of 60% to 65%. Wall motion was normal; there were no regional wall motion abnormalities. - Aortic valve: Mild regurgitation. - Left atrium: The atrium was mildly dilated.  Assessment / Plan: 1. Status post tissue aortic valve replacement. Valve function was good by echocardiogram in 2014. Exam is stable.  2. Coronary disease with occlusion of the right coronary. Status post single vessel bypass surgery 2010. Patient is asymptomatic.  3. Hypertension. Blood pressure is under good control. Continue amlodipine and lisinopril.  4. Hyperlipidemia. On Zocor and Zetia. Will schedule for lab work.

## 2016-05-02 ENCOUNTER — Ambulatory Visit (INDEPENDENT_AMBULATORY_CARE_PROVIDER_SITE_OTHER): Payer: Medicare Other | Admitting: Cardiology

## 2016-05-02 ENCOUNTER — Other Ambulatory Visit: Payer: Self-pay

## 2016-05-02 ENCOUNTER — Encounter: Payer: Self-pay | Admitting: Cardiology

## 2016-05-02 VITALS — BP 120/62 | HR 61 | Ht 69.0 in | Wt 154.0 lb

## 2016-05-02 DIAGNOSIS — I2510 Atherosclerotic heart disease of native coronary artery without angina pectoris: Secondary | ICD-10-CM | POA: Diagnosis not present

## 2016-05-02 DIAGNOSIS — Z952 Presence of prosthetic heart valve: Secondary | ICD-10-CM

## 2016-05-02 DIAGNOSIS — E7800 Pure hypercholesterolemia, unspecified: Secondary | ICD-10-CM | POA: Diagnosis not present

## 2016-05-02 DIAGNOSIS — I350 Nonrheumatic aortic (valve) stenosis: Secondary | ICD-10-CM

## 2016-05-02 LAB — CBC WITH DIFFERENTIAL/PLATELET
Basophils Absolute: 65 cells/uL (ref 0–200)
Basophils Relative: 1 %
EOS PCT: 3 %
Eosinophils Absolute: 195 cells/uL (ref 15–500)
HCT: 40.8 % (ref 38.5–50.0)
HEMOGLOBIN: 13.7 g/dL (ref 13.2–17.1)
LYMPHS ABS: 1690 {cells}/uL (ref 850–3900)
LYMPHS PCT: 26 %
MCH: 28.7 pg (ref 27.0–33.0)
MCHC: 33.6 g/dL (ref 32.0–36.0)
MCV: 85.4 fL (ref 80.0–100.0)
MONOS PCT: 13 %
MPV: 10 fL (ref 7.5–12.5)
Monocytes Absolute: 845 cells/uL (ref 200–950)
NEUTROS PCT: 57 %
Neutro Abs: 3705 cells/uL (ref 1500–7800)
PLATELETS: 218 10*3/uL (ref 140–400)
RBC: 4.78 MIL/uL (ref 4.20–5.80)
RDW: 13.2 % (ref 11.0–15.0)
WBC: 6.5 10*3/uL (ref 3.8–10.8)

## 2016-05-02 MED ORDER — LISINOPRIL 20 MG PO TABS: 20.0000 mg | ORAL_TABLET | Freq: Every day | ORAL | 3 refills | Status: DC

## 2016-05-02 MED ORDER — SIMVASTATIN 40 MG PO TABS: 40.0000 mg | ORAL_TABLET | Freq: Every evening | ORAL | 3 refills | Status: DC

## 2016-05-02 MED ORDER — EZETIMIBE 10 MG PO TABS: 10.0000 mg | ORAL_TABLET | Freq: Every day | ORAL | 3 refills | Status: AC

## 2016-05-02 MED ORDER — AMLODIPINE BESYLATE 5 MG PO TABS: 5.0000 mg | ORAL_TABLET | Freq: Every day | ORAL | 3 refills | Status: DC

## 2016-05-02 MED ORDER — LANSOPRAZOLE 30 MG PO CPDR: 30.0000 mg | DELAYED_RELEASE_CAPSULE | Freq: Every day | ORAL | 3 refills | Status: DC

## 2016-05-02 NOTE — Patient Instructions (Addendum)
We will check some blood work  Continue your current therapy  I will see you in one year

## 2016-05-03 LAB — LIPID PANEL
CHOL/HDL RATIO: 2.8 ratio (ref <5.0)
Cholesterol: 145 mg/dL (ref <200)
HDL: 52 mg/dL (ref >40)
LDL Cholesterol: 70 mg/dL (ref <100)
Triglycerides: 116 mg/dL (ref <150)
VLDL: 23 mg/dL (ref <30)

## 2016-05-03 LAB — BASIC METABOLIC PANEL
BUN: 19 mg/dL (ref 7–25)
CALCIUM: 9.7 mg/dL (ref 8.6–10.3)
CHLORIDE: 106 mmol/L (ref 98–110)
CO2: 24 mmol/L (ref 20–31)
Creat: 1.24 mg/dL — ABNORMAL HIGH (ref 0.70–1.11)
Glucose, Bld: 83 mg/dL (ref 65–99)
Potassium: 4.3 mmol/L (ref 3.5–5.3)
SODIUM: 141 mmol/L (ref 135–146)

## 2016-05-03 LAB — HEPATIC FUNCTION PANEL
ALBUMIN: 4.4 g/dL (ref 3.6–5.1)
ALT: 29 U/L (ref 9–46)
AST: 35 U/L (ref 10–35)
Alkaline Phosphatase: 54 U/L (ref 40–115)
BILIRUBIN TOTAL: 0.6 mg/dL (ref 0.2–1.2)
Bilirubin, Direct: 0.2 mg/dL (ref <=0.2)
Indirect Bilirubin: 0.4 mg/dL (ref 0.2–1.2)
Total Protein: 7.2 g/dL (ref 6.1–8.1)

## 2016-05-10 DIAGNOSIS — L989 Disorder of the skin and subcutaneous tissue, unspecified: Secondary | ICD-10-CM | POA: Diagnosis not present

## 2016-05-10 DIAGNOSIS — D485 Neoplasm of uncertain behavior of skin: Secondary | ICD-10-CM | POA: Diagnosis not present

## 2016-05-10 DIAGNOSIS — Z23 Encounter for immunization: Secondary | ICD-10-CM | POA: Diagnosis not present

## 2016-05-10 DIAGNOSIS — L570 Actinic keratosis: Secondary | ICD-10-CM | POA: Diagnosis not present

## 2016-06-29 DIAGNOSIS — Z961 Presence of intraocular lens: Secondary | ICD-10-CM | POA: Diagnosis not present

## 2016-06-29 DIAGNOSIS — H5201 Hypermetropia, right eye: Secondary | ICD-10-CM | POA: Diagnosis not present

## 2016-06-29 DIAGNOSIS — H35363 Drusen (degenerative) of macula, bilateral: Secondary | ICD-10-CM | POA: Diagnosis not present

## 2016-07-17 DIAGNOSIS — L309 Dermatitis, unspecified: Secondary | ICD-10-CM | POA: Diagnosis not present

## 2016-07-17 DIAGNOSIS — Z0000 Encounter for general adult medical examination without abnormal findings: Secondary | ICD-10-CM | POA: Diagnosis not present

## 2016-07-17 DIAGNOSIS — L290 Pruritus ani: Secondary | ICD-10-CM | POA: Diagnosis not present

## 2016-07-17 DIAGNOSIS — I10 Essential (primary) hypertension: Secondary | ICD-10-CM | POA: Diagnosis not present

## 2016-09-29 DIAGNOSIS — M180 Bilateral primary osteoarthritis of first carpometacarpal joints: Secondary | ICD-10-CM | POA: Diagnosis not present

## 2016-09-29 DIAGNOSIS — M79641 Pain in right hand: Secondary | ICD-10-CM | POA: Diagnosis not present

## 2016-09-29 DIAGNOSIS — M79642 Pain in left hand: Secondary | ICD-10-CM | POA: Diagnosis not present

## 2016-10-18 DIAGNOSIS — L290 Pruritus ani: Secondary | ICD-10-CM | POA: Diagnosis not present

## 2016-12-14 DIAGNOSIS — L03011 Cellulitis of right finger: Secondary | ICD-10-CM | POA: Diagnosis not present

## 2016-12-22 DIAGNOSIS — M7989 Other specified soft tissue disorders: Secondary | ICD-10-CM | POA: Diagnosis not present

## 2016-12-22 DIAGNOSIS — Z23 Encounter for immunization: Secondary | ICD-10-CM | POA: Diagnosis not present

## 2016-12-22 DIAGNOSIS — M795 Residual foreign body in soft tissue: Secondary | ICD-10-CM | POA: Diagnosis not present

## 2017-02-19 DIAGNOSIS — L03114 Cellulitis of left upper limb: Secondary | ICD-10-CM | POA: Diagnosis not present

## 2017-02-23 DIAGNOSIS — T148XXA Other injury of unspecified body region, initial encounter: Secondary | ICD-10-CM | POA: Diagnosis not present

## 2017-03-02 ENCOUNTER — Other Ambulatory Visit: Payer: Self-pay | Admitting: Cardiology

## 2017-03-16 DIAGNOSIS — Z23 Encounter for immunization: Secondary | ICD-10-CM | POA: Diagnosis not present

## 2017-05-16 ENCOUNTER — Other Ambulatory Visit: Payer: Self-pay | Admitting: Cardiology

## 2017-05-16 NOTE — Telephone Encounter (Signed)
REFILL 

## 2017-05-22 ENCOUNTER — Other Ambulatory Visit: Payer: Self-pay | Admitting: Cardiology

## 2017-05-28 NOTE — Progress Notes (Deleted)
Noah Martin Date of Birth: [birth year]   History of Present Illness: Noah Martin is seen today for followup of CAD/AVR. He has a history of coronary disease. The right coronary is occluded with left to right collaterals. He has disease in a small diagonal branch. In 2010 he underwent coronary bypass surgery x1 and aortic valve replacement with a pericardial tissue valve for aortic stenosis. Echo in 8/14 showed normal LV function and normal valve function.  On follow up today he continues to do very well from a cardiac standpoint. He remains active. He walks at least a mile/day. Denies any chest pain or SOB.   Current Outpatient Medications on File Prior to Visit  Medication Sig Dispense Refill  . amLODipine (NORVASC) 5 MG tablet TAKE ONE TABLET BY MOUTH DAILY 90 tablet 0  . aspirin EC 325 MG tablet Take 325 mg by mouth daily.    Marland Kitchen ezetimibe (ZETIA) 10 MG tablet Take 1 tablet (10 mg total) by mouth daily. 90 tablet 3  . ezetimibe (ZETIA) 10 MG tablet TAKE 1 TABLET DAILY 90 tablet 3  . lansoprazole (PREVACID) 30 MG capsule TAKE ONE CAPSULE BY MOUTH DAILY 90 capsule 2  . lisinopril (PRINIVIL,ZESTRIL) 20 MG tablet Take 1 tablet (20 mg total) by mouth daily. KEEP OV. 90 tablet 0  . simvastatin (ZOCOR) 40 MG tablet Take 1 tablet (40 mg total) by mouth every evening. KEEP OV. 90 tablet 0   No current facility-administered medications on file prior to visit.     No Known Allergies  Past Medical History:  Diagnosis Date  . Acute renal insufficiency    postoperatively  . Anemia   . Aortic insufficiency    mild, -- with a markedly dilated aortic root  . Aortic stenosis    status post aortic valve replacement  . Atrial fibrillation ([place])    Intraoperative and postoperative atrial fibrillation  . Coronary artery disease    status post CABG x1 -- with endo vein to the RCA  . Hyperlipidemia   . Hypertension   . Nephrolithiasis   . Peptic ulcer    status post gastrectomy  .  Pulmonary nodules    chronic -- 2008-2009 without change  . Right coronary artery occlusion [facility])    Chronic occlusion of right coronary artery    Past Surgical History:  Procedure Laterality Date  . AORTIC VALVE REPLACEMENT  06/04/2008   Aortic valve replacement with a 25 mm model [place] pericardial tissue valve, serial number [id number] -- Coronary artery bypass grafting x1 with endovein  . BILROTH II PROCEDURE     anastomosis -- Peptic ulcer surgery  . CARDIAC CATHETERIZATION  05/19/2008   Two-vessel obstructive CAD involving small diagonal branch.  The RCA is occluded and has left-to-right collaterals --  Normal LV function -- Normal pulmonary artery pressures -- Moderate aortic stenosis -- Moderate aortic root enlargement with mild aortic insufficiency     . CORONARY STENT PLACEMENT  2000   stent placement in the circumflex coronary artery  . FINGER AMPUTATION     post partial amputation of right index finger    Social History   Tobacco Use  Smoking Status Former Smoker  . Packs/day: 1.00  . Years: 14.00  . Pack years: 14.00  . Types: Cigarettes  . Last attempt to quit: 12/04/1972  . Years since quitting: 44.5  Smokeless Tobacco Never Used    Social History   Substance and Sexual Activity  Alcohol Use No  Family History  Problem Relation Age of Onset  . Heart attack Mother   . Coronary artery disease Brother        early onset / with CABG    Review of Systems: As noted in history of present illness.  All other systems were reviewed and are negative.  Physical Exam: There were no vitals taken for this visit. He is a pleasant white male in no acute distress. HEENT is unremarkable.  He has no JVD or bruits. Carotid upstrokes are normal. Lungs are clear. Cardiac exam reveals a grade 1/6 systolic ejection murmur at the right upper sternal border and apex. Abdomen is soft and nontender. He has no masses or bruits. Extremities are without edema. Pedal  pulses are good. Skin is warm and dry. He is alert oriented x3. Cranial nerves II through XII are intact.  LABORATORY DATA: Lab Results  Component Value Date   WBC 6.5 05/02/2016   HGB 13.7 05/02/2016   HCT 40.8 05/02/2016   PLT 218 05/02/2016   GLUCOSE 83 05/02/2016   CHOL 145 05/02/2016   TRIG 116 05/02/2016   HDL 52 05/02/2016   LDLCALC 70 05/02/2016   ALT 29 05/02/2016   AST 35 05/02/2016   NA 141 05/02/2016   K 4.3 05/02/2016   CL 106 05/02/2016   CREATININE 1.24 (H) 05/02/2016   BUN 19 05/02/2016   CO2 24 05/02/2016   INR 1.7 (H) 06/03/2008   HGBA1C  05/29/2008    5.8 (NOTE)   The ADA recommends the following therapeutic goal for glycemic   control related to Hgb A1C measurement:   Goal of Therapy:   < 7.0% Hgb A1C   Reference: American Diabetes Association: Clinical Practice   Recommendations 2008, Diabetes Care,  2008, 31:(Suppl 1).    ECG today demonstrates normal sinus rhythm a rate of 61 beats per minute. He has first degree AV block. It is otherwise normal. I have personally reviewed and interpreted this study.  Echo 12/13/14: Study Conclusions  - Left ventricle: The cavity size was normal. Wall thickness was increased in a pattern of mild LVH. There was focal basal hypertrophy. Systolic function was normal. The estimated ejection fraction was in the range of 60% to 65%. Wall motion was normal; there were no regional wall motion abnormalities. - Aortic valve: Mild regurgitation. - Left atrium: The atrium was mildly dilated.  Assessment / Plan: 1. Status post tissue aortic valve replacement. Valve function was good by echocardiogram in 2014. Exam is stable.  2. Coronary disease with occlusion of the right coronary. Status post single vessel bypass surgery 2010. Patient is asymptomatic.  3. Hypertension. Blood pressure is under good control. Continue amlodipine and lisinopril.  4. Hyperlipidemia. On Zocor and Zetia. Will schedule for lab work.

## 2017-06-05 ENCOUNTER — Ambulatory Visit: Payer: Medicare Other | Admitting: Cardiology

## 2017-06-18 DIAGNOSIS — K4090 Unilateral inguinal hernia, without obstruction or gangrene, not specified as recurrent: Secondary | ICD-10-CM | POA: Diagnosis not present

## 2017-06-19 ENCOUNTER — Emergency Department (HOSPITAL_COMMUNITY)
Admission: EM | Admit: 2017-06-19 | Discharge: 2017-06-19 | Discharge status: Against Medical Advice | Payer: Medicare Other | Attending: Emergency Medicine | Admitting: Emergency Medicine

## 2017-06-19 ENCOUNTER — Encounter (HOSPITAL_COMMUNITY): Payer: Self-pay | Admitting: Emergency Medicine

## 2017-06-19 DIAGNOSIS — K439 Ventral hernia without obstruction or gangrene: Secondary | ICD-10-CM | POA: Diagnosis not present

## 2017-06-19 DIAGNOSIS — Z5321 Procedure and treatment not carried out due to patient leaving prior to being seen by health care provider: Secondary | ICD-10-CM | POA: Insufficient documentation

## 2017-06-19 NOTE — ED Triage Notes (Signed)
Patient c/o right inguinal hernia x2 weeks. States he was seen at PCP yesterday and told to follow up with surgeon. Reports "burning and stinging" started today. Denies changes in bowel and bladder. Ambulatory.

## 2017-06-19 NOTE — ED Notes (Signed)
Patient reports he is not hurting at this time and is going to leave.

## 2017-06-19 NOTE — ED Notes (Signed)
Called 3 times and no response.

## 2017-06-27 ENCOUNTER — Telehealth: Payer: Self-pay | Admitting: *Deleted

## 2017-06-27 DIAGNOSIS — K4090 Unilateral inguinal hernia, without obstruction or gangrene, not specified as recurrent: Secondary | ICD-10-CM | POA: Diagnosis not present

## 2017-06-27 DIAGNOSIS — Z952 Presence of prosthetic heart valve: Secondary | ICD-10-CM | POA: Diagnosis not present

## 2017-06-27 DIAGNOSIS — I10 Essential (primary) hypertension: Secondary | ICD-10-CM | POA: Diagnosis not present

## 2017-06-27 DIAGNOSIS — K219 Gastro-esophageal reflux disease without esophagitis: Secondary | ICD-10-CM | POA: Diagnosis not present

## 2017-06-27 NOTE — Telephone Encounter (Signed)
   [facility] Pre-operative Risk Assessment    Request for surgical clearance:  1. What type of surgery is being performed? OPEN HERNIA REPAIR   2. When is this surgery scheduled? TBD   3. What type of clearance is required (medical clearance vs. Pharmacy clearance to hold med vs. Both)? MEDICAL   4. Are there any medications that need to be held prior to surgery and how long?   5. Practice name and name of physician performing surgery? [facility] DR ERIC WILSON    6. What is your office phone and fax number? PHONE [phone] FAX [phone] ATTN: April  STAT[place], [place]   7. Anesthesia type (None, local, MAC, general) ? GENERAL ANESTHESIA

## 2017-06-27 NOTE — Telephone Encounter (Signed)
Forwarded to requesting party via EPIC

## 2017-06-27 NOTE — Telephone Encounter (Signed)
   Primary Cardiologist: Peter Martinique, MD  Chart reviewed as part of pre-operative protocol coverage. Given past medical history and time since last visit, based on ACC/AHA guidelines, FAIZAN GERACI would be at acceptable risk for the planned procedure without further cardiovascular testing.   She is not on anticoagulation. Continue ASA.   I will route this recommendation to the requesting party via Epic fax function and remove from pre-op pool.  Please call with questions.  Phill Myron. West Pugh, ANP, AACC  06/27/2017, 4:50 PM

## 2017-07-02 DIAGNOSIS — H5201 Hypermetropia, right eye: Secondary | ICD-10-CM | POA: Diagnosis not present

## 2017-07-02 DIAGNOSIS — Z961 Presence of intraocular lens: Secondary | ICD-10-CM | POA: Diagnosis not present

## 2017-07-23 DIAGNOSIS — Z0000 Encounter for general adult medical examination without abnormal findings: Secondary | ICD-10-CM | POA: Diagnosis not present

## 2017-07-23 DIAGNOSIS — I10 Essential (primary) hypertension: Secondary | ICD-10-CM | POA: Diagnosis not present

## 2017-07-23 DIAGNOSIS — E785 Hyperlipidemia, unspecified: Secondary | ICD-10-CM | POA: Diagnosis not present

## 2017-07-28 NOTE — Progress Notes (Signed)
Noah Martin Date of Birth: Nov 28, 1929   History of Present Illness: Noah Martin is seen today for followup of CAD/AVR. He has a history of coronary disease. The right coronary is occluded with left to right collaterals. He has disease in a small diagonal branch. In 2010 he underwent coronary bypass surgery x1 and aortic valve replacement with a pericardial tissue valve for aortic stenosis. Echo in 8/14 showed normal LV function and normal valve function.   On follow up today he continues to do very well from a cardiac standpoint. [place] is [place]. No chest pain, palpitations, dyspnea. No edema. Notes he is scheduled for inguinal hernia repair this week.   Current Outpatient Medications on File Prior to Visit  Medication Sig Dispense Refill  . amLODipine (NORVASC) 5 MG tablet TAKE ONE TABLET BY MOUTH DAILY 90 tablet 0  . ezetimibe (ZETIA) 10 MG tablet Take 1 tablet (10 mg total) by mouth daily. 90 tablet 3  . lansoprazole (PREVACID) 30 MG capsule TAKE ONE CAPSULE BY MOUTH DAILY 90 capsule 2  . lisinopril (PRINIVIL,ZESTRIL) 20 MG tablet Take 1 tablet (20 mg total) by mouth daily. KEEP OV. 90 tablet 0  . simvastatin (ZOCOR) 40 MG tablet Take 1 tablet (40 mg total) by mouth every evening. KEEP OV. 90 tablet 0  . ezetimibe (ZETIA) 10 MG tablet TAKE 1 TABLET DAILY 90 tablet 3   No current facility-administered medications on file prior to visit.     No Known Allergies  Past Medical History:  Diagnosis Date  . Acute renal insufficiency    postoperatively  . Anemia   . Aortic insufficiency    mild, -- with a markedly dilated aortic root  . Aortic stenosis    status post aortic valve replacement  . Atrial fibrillation ([place])    Intraoperative and postoperative atrial fibrillation  . Coronary artery disease    status post CABG x1 -- with endo vein to the RCA  . Hyperlipidemia   . Hypertension   . Nephrolithiasis   . Peptic ulcer    status post gastrectomy  .  Pulmonary nodules    chronic -- 2008-2009 without change  . Right coronary artery occlusion [facility])    Chronic occlusion of right coronary artery    Past Surgical History:  Procedure Laterality Date  . AORTIC VALVE REPLACEMENT  06/04/2008   Aortic valve replacement with a 25 mm model [place] pericardial tissue valve, serial number [id number] -- Coronary artery bypass grafting x1 with endovein  . BILROTH II PROCEDURE     anastomosis -- Peptic ulcer surgery  . CARDIAC CATHETERIZATION  05/19/2008   Two-vessel obstructive CAD involving small diagonal branch.  The RCA is occluded and has left-to-right collaterals --  Normal LV function -- Normal pulmonary artery pressures -- Moderate aortic stenosis -- Moderate aortic root enlargement with mild aortic insufficiency     . CORONARY STENT PLACEMENT  2000   stent placement in the circumflex coronary artery  . FINGER AMPUTATION     post partial amputation of right index finger    Social History   Tobacco Use  Smoking Status Former Smoker  . Packs/day: 1.00  . Years: 14.00  . Pack years: 14.00  . Types: Cigarettes  . Last attempt to quit: 12/04/1972  . Years since quitting: 44.6  Smokeless Tobacco Never Used    Social History   Substance and Sexual Activity  Alcohol Use No    Family History  Problem Relation Age of Onset  . Heart attack Mother   . Coronary artery disease Brother        early onset / with CABG    Review of Systems: As noted in history of present illness.  All other systems were reviewed and are negative.  Physical Exam: BP 140/68   Pulse (!) 55   Ht 5\' 9"  (1.753 m)   Wt 151 lb 12.8 oz (68.9 kg)   BMI 22.42 kg/m  GENERAL:  Well appearing, thin WM appears younger than stated age. HEENT:  PERRL, EOMI, sclera are clear. Oropharynx is clear. NECK:  No jugular venous distention, carotid upstroke brisk and symmetric, no bruits, no thyromegaly or adenopathy LUNGS:  Clear to auscultation  bilaterally CHEST:  Unremarkable HEART:  RRR,  PMI not displaced or sustained,S1 and S2 within normal limits, no S3, no S4: no clicks, no rubs, harsh grade 2/6 systolic murmur at the apex. ABD:  Soft, nontender. BS +, no masses or bruits. No hepatomegaly, no splenomegaly EXT:  2 + pulses throughout, no edema, no cyanosis no clubbing SKIN:  Warm and dry.  No rashes NEURO:  Alert and oriented x 3. Cranial nerves II through XII intact. PSYCH:  Cognitively intact    LABORATORY DATA: Lab Results  Component Value Date   WBC 6.5 05/02/2016   HGB 13.7 05/02/2016   HCT 40.8 05/02/2016   PLT 218 05/02/2016   GLUCOSE 83 05/02/2016   CHOL 145 05/02/2016   TRIG 116 05/02/2016   HDL 52 05/02/2016   LDLCALC 70 05/02/2016   ALT 29 05/02/2016   AST 35 05/02/2016   NA 141 05/02/2016   K 4.3 05/02/2016   CL 106 05/02/2016   CREATININE 1.24 (H) 05/02/2016   BUN 19 05/02/2016   CO2 24 05/02/2016   INR 1.7 (H) 06/03/2008   HGBA1C  05/29/2008    5.8 (NOTE)   The ADA recommends the following therapeutic goal for glycemic   control related to Hgb A1C measurement:   Goal of Therapy:   < 7.0% Hgb A1C   Reference: American Diabetes Association: Clinical Practice   Recommendations 2008, Diabetes Care,  2008, 31:(Suppl 1).   Dated 07/23/17: cholesterol 139, triglycerides 102, HDL 51, LDL 67. Creatinine 1.27. CBC and chemistries normal.   ECG today demonstrates normal sinus rhythm a rate of 55 beats per minute. He has first degree AV block. It is otherwise normal. I have personally reviewed and interpreted this study.  Echo 12/12/12: Study Conclusions  - Left ventricle: The cavity size was normal. Wall thickness was increased in a pattern of mild LVH. There was focal basal hypertrophy. Systolic function was normal. The estimated ejection fraction was in the range of 60% to 65%. Wall motion was normal; there were no regional wall motion abnormalities. - Aortic valve: Mild regurgitation. -  Left atrium: The atrium was mildly dilated.  Assessment / Plan: 1. Status post tissue aortic valve replacement. Valve function was good by echocardiogram in 2014. He does have a modest apical murmur on exam. Will update  Echo at this time.  2. Coronary disease with occlusion of the right coronary. Status post single vessel bypass surgery 2010. Patient is asymptomatic.  3. Hypertension. Blood pressure is under good control. Continue amlodipine and lisinopril.  4. Hyperlipidemia. On Zocor and Zetia. Excellent control.   5. Inguinal hernia repair. Low risk from a cardiac standpoint for surgery.

## 2017-07-30 ENCOUNTER — Ambulatory Visit (INDEPENDENT_AMBULATORY_CARE_PROVIDER_SITE_OTHER): Payer: Medicare Other | Admitting: Cardiology

## 2017-07-30 ENCOUNTER — Encounter: Payer: Self-pay | Admitting: Cardiology

## 2017-07-30 VITALS — BP 140/68 | HR 55 | Ht 69.0 in | Wt 151.8 lb

## 2017-07-30 DIAGNOSIS — I2510 Atherosclerotic heart disease of native coronary artery without angina pectoris: Secondary | ICD-10-CM

## 2017-07-30 DIAGNOSIS — E7800 Pure hypercholesterolemia, unspecified: Secondary | ICD-10-CM | POA: Diagnosis not present

## 2017-07-30 DIAGNOSIS — Z952 Presence of prosthetic heart valve: Secondary | ICD-10-CM | POA: Diagnosis not present

## 2017-07-30 MED ORDER — ASPIRIN EC 81 MG PO TBEC: 81.0000 mg | DELAYED_RELEASE_TABLET | Freq: Every day | ORAL | 3 refills | Status: AC

## 2017-07-30 NOTE — Patient Instructions (Signed)
You may reduce ASA to 81 mg daily  We will schedule you for an Echocardiogram  Continue your other therapy  I will see you in one year.

## 2017-08-02 ENCOUNTER — Other Ambulatory Visit: Payer: Self-pay

## 2017-08-02 ENCOUNTER — Ambulatory Visit (HOSPITAL_COMMUNITY): Payer: Medicare Other | Attending: Cardiology

## 2017-08-02 DIAGNOSIS — I083 Combined rheumatic disorders of mitral, aortic and tricuspid valves: Secondary | ICD-10-CM | POA: Diagnosis not present

## 2017-08-02 DIAGNOSIS — E7800 Pure hypercholesterolemia, unspecified: Secondary | ICD-10-CM | POA: Diagnosis not present

## 2017-08-02 DIAGNOSIS — I10 Essential (primary) hypertension: Secondary | ICD-10-CM | POA: Insufficient documentation

## 2017-08-02 DIAGNOSIS — I4891 Unspecified atrial fibrillation: Secondary | ICD-10-CM | POA: Diagnosis not present

## 2017-08-02 DIAGNOSIS — I2510 Atherosclerotic heart disease of native coronary artery without angina pectoris: Secondary | ICD-10-CM

## 2017-08-02 DIAGNOSIS — Z952 Presence of prosthetic heart valve: Secondary | ICD-10-CM

## 2017-08-03 DIAGNOSIS — K4090 Unilateral inguinal hernia, without obstruction or gangrene, not specified as recurrent: Secondary | ICD-10-CM | POA: Diagnosis not present

## 2017-08-03 DIAGNOSIS — D176 Benign lipomatous neoplasm of spermatic cord: Secondary | ICD-10-CM | POA: Diagnosis not present

## 2017-08-03 DIAGNOSIS — G8918 Other acute postprocedural pain: Secondary | ICD-10-CM | POA: Diagnosis not present

## 2017-08-12 ENCOUNTER — Other Ambulatory Visit: Payer: Self-pay | Admitting: Cardiology

## 2017-08-13 NOTE — Telephone Encounter (Signed)
Rx has been sent to the pharmacy electronically. ° °

## 2017-08-16 DIAGNOSIS — L309 Dermatitis, unspecified: Secondary | ICD-10-CM | POA: Diagnosis not present

## 2017-09-07 ENCOUNTER — Telehealth: Payer: Self-pay | Admitting: Cardiology

## 2017-09-07 NOTE — Telephone Encounter (Signed)
Returned call to patient of Dr. Martinique who reports low BP for a few days. He was seen in April and BP was 332 systolic. He is wondering if one of his BP meds should be decreased. He complains of weakness only.   88/51, 92/49, 98/51, 102/66  Routed to MD/LPN for follow up

## 2017-09-07 NOTE — Telephone Encounter (Signed)
New message    Pt c/o BP issue: STAT if pt c/o blurred vision, one-sided weakness or slurred speech  1. What are your last 5 BP readings? 88/51, 92/49, 98/51, 102/66  2. Are you having any other symptoms (ex. Dizziness, headache, blurred vision, passed out)? weak  3. What is your BP issue? Patient feels BP too low

## 2017-09-08 NOTE — Telephone Encounter (Signed)
He is on lisinopril and amlodipine for BP. I would reduce amlodipine to 2.5 mg daily. If low BP persist let me know and we would probably stop amlodipine then.  Nelline Lio Martinique MD, [facility]

## 2017-09-10 DIAGNOSIS — R21 Rash and other nonspecific skin eruption: Secondary | ICD-10-CM | POA: Diagnosis not present

## 2017-09-11 NOTE — Telephone Encounter (Signed)
Spoke to patient Dr.Jordan's recommendations given.Advised to call back if B/P continues to be low.

## 2017-11-22 ENCOUNTER — Telehealth: Payer: Self-pay | Admitting: Cardiology

## 2017-11-22 MED ORDER — AMLODIPINE BESYLATE 2.5 MG PO TABS: 2.5000 mg | ORAL_TABLET | Freq: Every day | ORAL | 3 refills | Status: DC

## 2017-11-22 NOTE — Telephone Encounter (Signed)
New Message    Pt c/o medication issue:  1. Name of Medication: amLODipine (NORVASC) 5 MG tablet  2. How are you currently taking this medication (dosage and times per day)?   3. Are you having a reaction (difficulty breathing--STAT)?   4. What is your medication issue? Patient is calling because is having a hard time halving this pill. He wants to know can a rx be sent for 2.5mg  tablet. Please call to discuss.

## 2017-11-22 NOTE — Telephone Encounter (Signed)
Returned call to patient, he states he is having a hard time cutting his amlodipine tablet in half.  He has 5 mg tablets but only takes 2.5 mg.   Request new rx sent for 2.5 mg tablet.    rx sent to pharmacy and patient verbalized understanding.

## 2017-12-10 DIAGNOSIS — H0011 Chalazion right upper eyelid: Secondary | ICD-10-CM | POA: Diagnosis not present

## 2018-01-03 ENCOUNTER — Telehealth: Payer: Self-pay | Admitting: Cardiology

## 2018-01-03 NOTE — Telephone Encounter (Signed)
Per Dr Doug Sou previous noted, is okay to stop amlodipine if BP remains low.

## 2018-01-03 NOTE — Telephone Encounter (Signed)
Returned call to patient he wanted to ask Dr.Jordan if he needs to stop taking Amlodipine 2.5 mg.Stated B/P has been low 101/49,92/49.Advised I will send message to our pharmacist for advice.

## 2018-01-03 NOTE — Telephone Encounter (Signed)
Returned call to patient Dr.Jordan's last note advised if B/P remains low to stop Amlodipine.Stated she will stop.Advised to continue to monitor B/P and call back if continues to be low.

## 2018-01-03 NOTE — Telephone Encounter (Signed)
Pt c/o BP issue: STAT if pt c/o blurred vision, one-sided weakness or slurred speech  1. What are your last 5 BP readings? 92/49 bp  68 pr  2. Are you having any other symptoms (ex. Dizziness, headache, blurred vision, passed out)? No   3. What is your BP issue?  Patient states his blood pressure is dropping. Patient wants to know if he needs to stop taking Amalodipine  Besylate 2.5 mg? Please contact patient to discuss.

## 2018-01-04 DIAGNOSIS — Z23 Encounter for immunization: Secondary | ICD-10-CM | POA: Diagnosis not present

## 2018-03-13 ENCOUNTER — Other Ambulatory Visit: Payer: Self-pay | Admitting: Cardiology

## 2018-04-17 ENCOUNTER — Telehealth: Payer: Self-pay | Admitting: Cardiology

## 2018-04-17 MED ORDER — LISINOPRIL 20 MG PO TABS: 20.0000 mg | ORAL_TABLET | Freq: Every day | ORAL | 1 refills | Status: DC

## 2018-04-17 NOTE — Telephone Encounter (Signed)
New message   Pt c/o medication issue:  1. Name of Medication: lisinopril (PRINIVIL,ZESTRIL) 20 MG tablet  2. How are you currently taking this medication (dosage and times per day)? 1 time daily  3. Are you having a reaction (difficulty breathing--STAT)? No   4. What is your medication issue? Patient states that this medication causes him to cough. Please advise.

## 2018-04-17 NOTE — Telephone Encounter (Signed)
Tried calling the patient to inform him of his Rx request for Lisinopril 20 mg being sent to his pharmacy.

## 2018-04-22 ENCOUNTER — Telehealth: Payer: Self-pay

## 2018-04-22 MED ORDER — LOSARTAN POTASSIUM 50 MG PO TABS: 50.0000 mg | ORAL_TABLET | Freq: Every day | ORAL | 6 refills | Status: DC

## 2018-04-22 NOTE — Telephone Encounter (Signed)
Spoke to patient about recent email stating Lisinopril causing a dry cough.Dr.Jordan advised to stop and start Losartan 50 mg daily.

## 2018-05-09 ENCOUNTER — Ambulatory Visit
Admission: RE | Admit: 2018-05-09 | Discharge: 2018-05-09 | Disposition: A | Discharge status: Home / Self Care | Payer: Medicare Other | Source: Ambulatory Visit | Attending: Family Medicine | Admitting: Family Medicine

## 2018-05-09 ENCOUNTER — Other Ambulatory Visit: Payer: Self-pay | Admitting: Family Medicine

## 2018-05-09 DIAGNOSIS — R05 Cough: Secondary | ICD-10-CM | POA: Diagnosis not present

## 2018-05-09 DIAGNOSIS — R059 Cough, unspecified: Secondary | ICD-10-CM

## 2018-05-09 DIAGNOSIS — R350 Frequency of micturition: Secondary | ICD-10-CM | POA: Diagnosis not present

## 2018-05-09 DIAGNOSIS — J181 Lobar pneumonia, unspecified organism: Secondary | ICD-10-CM | POA: Diagnosis not present

## 2018-05-09 DIAGNOSIS — R634 Abnormal weight loss: Secondary | ICD-10-CM | POA: Diagnosis not present

## 2018-05-10 ENCOUNTER — Telehealth: Payer: Self-pay

## 2018-05-10 NOTE — Telephone Encounter (Signed)
Spoke to patient cxr results given.He stated he wanted Dr.Jordan to know since Lisinopril was stopped due to a cough and Losartan 50 mg daily started his B/P has been low.He has felt weak and dizzy.B/P ranging 82/42,85/60,97/46,100/47,95/45.Advised to hold Lorsartan over the weekend.I will speak to Dr.Jordan on Mon 05/13/18 and call you back.

## 2018-05-16 ENCOUNTER — Telehealth: Payer: Self-pay

## 2018-05-16 NOTE — Telephone Encounter (Signed)
Spoke to patient 05/13/18 Dr.Jordan advised to continue to hold Losartan.Advised to monitor B/P and call back if becomes elevated.

## 2018-05-18 ENCOUNTER — Other Ambulatory Visit: Payer: Self-pay | Admitting: Cardiology

## 2018-05-23 ENCOUNTER — Other Ambulatory Visit: Payer: Self-pay | Admitting: Family Medicine

## 2018-05-23 ENCOUNTER — Ambulatory Visit
Admission: RE | Admit: 2018-05-23 | Discharge: 2018-05-23 | Disposition: A | Discharge status: Home / Self Care | Payer: Medicare Other | Source: Ambulatory Visit | Attending: Family Medicine | Admitting: Family Medicine

## 2018-05-23 DIAGNOSIS — J189 Pneumonia, unspecified organism: Secondary | ICD-10-CM

## 2018-05-23 DIAGNOSIS — R05 Cough: Secondary | ICD-10-CM | POA: Diagnosis not present

## 2018-05-25 ENCOUNTER — Emergency Department (HOSPITAL_COMMUNITY): Payer: Medicare Other

## 2018-05-25 ENCOUNTER — Inpatient Hospital Stay (HOSPITAL_COMMUNITY)
Admission: EM | Admit: 2018-05-25 | Discharge: 2018-05-28 | DRG: 166 | Disposition: A | Discharge status: Home / Self Care | Payer: Medicare Other | Attending: Internal Medicine | Admitting: Internal Medicine

## 2018-05-25 ENCOUNTER — Other Ambulatory Visit: Payer: Self-pay

## 2018-05-25 ENCOUNTER — Encounter (HOSPITAL_COMMUNITY): Payer: Self-pay | Admitting: Emergency Medicine

## 2018-05-25 DIAGNOSIS — Z953 Presence of xenogenic heart valve: Secondary | ICD-10-CM | POA: Diagnosis not present

## 2018-05-25 DIAGNOSIS — R918 Other nonspecific abnormal finding of lung field: Secondary | ICD-10-CM | POA: Diagnosis not present

## 2018-05-25 DIAGNOSIS — I10 Essential (primary) hypertension: Secondary | ICD-10-CM | POA: Diagnosis present

## 2018-05-25 DIAGNOSIS — C7971 Secondary malignant neoplasm of right adrenal gland: Secondary | ICD-10-CM | POA: Diagnosis not present

## 2018-05-25 DIAGNOSIS — C787 Secondary malignant neoplasm of liver and intrahepatic bile duct: Secondary | ICD-10-CM | POA: Diagnosis not present

## 2018-05-25 DIAGNOSIS — Z66 Do not resuscitate: Secondary | ICD-10-CM | POA: Diagnosis present

## 2018-05-25 DIAGNOSIS — C3431 Malignant neoplasm of lower lobe, right bronchus or lung: Secondary | ICD-10-CM | POA: Diagnosis not present

## 2018-05-25 DIAGNOSIS — Z79899 Other long term (current) drug therapy: Secondary | ICD-10-CM

## 2018-05-25 DIAGNOSIS — J9601 Acute respiratory failure with hypoxia: Secondary | ICD-10-CM | POA: Diagnosis not present

## 2018-05-25 DIAGNOSIS — Z681 Body mass index (BMI) 19 or less, adult: Secondary | ICD-10-CM

## 2018-05-25 DIAGNOSIS — J189 Pneumonia, unspecified organism: Principal | ICD-10-CM | POA: Diagnosis present

## 2018-05-25 DIAGNOSIS — N400 Enlarged prostate without lower urinary tract symptoms: Secondary | ICD-10-CM | POA: Diagnosis not present

## 2018-05-25 DIAGNOSIS — Z87891 Personal history of nicotine dependence: Secondary | ICD-10-CM

## 2018-05-25 DIAGNOSIS — I2510 Atherosclerotic heart disease of native coronary artery without angina pectoris: Secondary | ICD-10-CM | POA: Diagnosis present

## 2018-05-25 DIAGNOSIS — E785 Hyperlipidemia, unspecified: Secondary | ICD-10-CM | POA: Diagnosis not present

## 2018-05-25 DIAGNOSIS — Z89021 Acquired absence of right finger(s): Secondary | ICD-10-CM

## 2018-05-25 DIAGNOSIS — Z951 Presence of aortocoronary bypass graft: Secondary | ICD-10-CM

## 2018-05-25 DIAGNOSIS — J90 Pleural effusion, not elsewhere classified: Secondary | ICD-10-CM | POA: Diagnosis not present

## 2018-05-25 DIAGNOSIS — E440 Moderate protein-calorie malnutrition: Secondary | ICD-10-CM | POA: Diagnosis not present

## 2018-05-25 DIAGNOSIS — Z7982 Long term (current) use of aspirin: Secondary | ICD-10-CM

## 2018-05-25 DIAGNOSIS — Z955 Presence of coronary angioplasty implant and graft: Secondary | ICD-10-CM

## 2018-05-25 DIAGNOSIS — I4891 Unspecified atrial fibrillation: Secondary | ICD-10-CM | POA: Diagnosis present

## 2018-05-25 LAB — CBC WITH DIFFERENTIAL/PLATELET
ABS IMMATURE GRANULOCYTES: 0.05 10*3/uL (ref 0.00–0.07)
Basophils Absolute: 0.1 10*3/uL (ref 0.0–0.1)
Basophils Relative: 0 %
Eosinophils Absolute: 0.1 10*3/uL (ref 0.0–0.5)
Eosinophils Relative: 1 %
HCT: 39.3 % (ref 39.0–52.0)
Hemoglobin: 12.6 g/dL — ABNORMAL LOW (ref 13.0–17.0)
IMMATURE GRANULOCYTES: 0 %
Lymphocytes Relative: 7 %
Lymphs Abs: 0.7 10*3/uL (ref 0.7–4.0)
MCH: 28.1 pg (ref 26.0–34.0)
MCHC: 32.1 g/dL (ref 30.0–36.0)
MCV: 87.5 fL (ref 80.0–100.0)
Monocytes Absolute: 1.4 10*3/uL — ABNORMAL HIGH (ref 0.1–1.0)
Monocytes Relative: 13 %
NEUTROS ABS: 8.8 10*3/uL — AB (ref 1.7–7.7)
Neutrophils Relative %: 79 %
PLATELETS: 294 10*3/uL (ref 150–400)
RBC: 4.49 MIL/uL (ref 4.22–5.81)
RDW: 13 % (ref 11.5–15.5)
WBC: 11.2 10*3/uL — ABNORMAL HIGH (ref 4.0–10.5)
nRBC: 0 % (ref 0.0–0.2)

## 2018-05-25 LAB — COMPREHENSIVE METABOLIC PANEL
ALT: 36 U/L (ref 0–44)
AST: 48 U/L — AB (ref 15–41)
Albumin: 3.2 g/dL — ABNORMAL LOW (ref 3.5–5.0)
Alkaline Phosphatase: 66 U/L (ref 38–126)
Anion gap: 9 (ref 5–15)
BUN: 23 mg/dL (ref 8–23)
CHLORIDE: 104 mmol/L (ref 98–111)
CO2: 22 mmol/L (ref 22–32)
Calcium: 8.9 mg/dL (ref 8.9–10.3)
Creatinine, Ser: 1.15 mg/dL (ref 0.61–1.24)
GFR calc Af Amer: >60 mL/min (ref >60)
GFR calc non Af Amer: 57 mL/min — ABNORMAL LOW (ref >60)
Glucose, Bld: 102 mg/dL — ABNORMAL HIGH (ref 70–99)
Potassium: 3.7 mmol/L (ref 3.5–5.1)
Sodium: 135 mmol/L (ref 135–145)
Total Bilirubin: 1 mg/dL (ref 0.3–1.2)
Total Protein: 7.2 g/dL (ref 6.5–8.1)

## 2018-05-25 MED ORDER — ENOXAPARIN SODIUM 40 MG/0.4ML ~~LOC~~ SOLN
40.0000 mg | SUBCUTANEOUS | Status: DC
  Administered 2018-05-25 – 2018-05-26 (×2): 40 mg via SUBCUTANEOUS
  Filled 2018-05-25 (×2): qty 0.4

## 2018-05-25 MED ORDER — SODIUM CHLORIDE 0.9 % IV BOLUS
1000.0000 mL | Freq: Once | INTRAVENOUS | Status: AC
  Administered 2018-05-25: 1000 mL via INTRAVENOUS

## 2018-05-25 MED ORDER — PANTOPRAZOLE SODIUM 20 MG PO TBEC
20.0000 mg | DELAYED_RELEASE_TABLET | Freq: Every day | ORAL | Status: DC
  Administered 2018-05-25 – 2018-05-27 (×3): 20 mg via ORAL
  Filled 2018-05-25 (×5): qty 1

## 2018-05-25 MED ORDER — SODIUM CHLORIDE 0.9 % IV SOLN
1.0000 g | Freq: Once | INTRAVENOUS | Status: AC
  Administered 2018-05-25: 1 g via INTRAVENOUS
  Filled 2018-05-25: qty 1

## 2018-05-25 MED ORDER — IPRATROPIUM-ALBUTEROL 0.5-2.5 (3) MG/3ML IN SOLN
3.0000 mL | Freq: Three times a day (TID) | RESPIRATORY_TRACT | Status: DC
  Administered 2018-05-25 – 2018-05-26 (×2): 3 mL via RESPIRATORY_TRACT
  Filled 2018-05-25 (×2): qty 3

## 2018-05-25 MED ORDER — IOHEXOL 300 MG/ML  SOLN
75.0000 mL | Freq: Once | INTRAMUSCULAR | Status: AC | PRN
  Administered 2018-05-25: 75 mL via INTRAVENOUS

## 2018-05-25 MED ORDER — PIPERACILLIN-TAZOBACTAM 3.375 G IVPB
3.3750 g | Freq: Three times a day (TID) | INTRAVENOUS | Status: DC
  Administered 2018-05-25 – 2018-05-28 (×8): 3.375 g via INTRAVENOUS
  Filled 2018-05-25 (×11): qty 50

## 2018-05-25 MED ORDER — EZETIMIBE 10 MG PO TABS
10.0000 mg | ORAL_TABLET | Freq: Every day | ORAL | Status: DC
  Administered 2018-05-25 – 2018-05-27 (×3): 10 mg via ORAL
  Filled 2018-05-25 (×3): qty 1

## 2018-05-25 MED ORDER — SIMVASTATIN 40 MG PO TABS
40.0000 mg | ORAL_TABLET | Freq: Every day | ORAL | Status: DC
  Administered 2018-05-25 – 2018-05-27 (×3): 40 mg via ORAL
  Filled 2018-05-25 (×4): qty 1

## 2018-05-25 MED ORDER — VANCOMYCIN HCL IN DEXTROSE 1-5 GM/200ML-% IV SOLN
1000.0000 mg | Freq: Once | INTRAVENOUS | Status: AC
  Administered 2018-05-25: 1000 mg via INTRAVENOUS
  Filled 2018-05-25: qty 200

## 2018-05-25 MED ORDER — SODIUM CHLORIDE (PF) 0.9 % IJ SOLN
INTRAMUSCULAR | Status: AC
  Filled 2018-05-25: qty 50

## 2018-05-25 MED ORDER — ASPIRIN EC 81 MG PO TBEC
81.0000 mg | DELAYED_RELEASE_TABLET | Freq: Every day | ORAL | Status: DC
  Administered 2018-05-25 – 2018-05-27 (×3): 81 mg via ORAL
  Filled 2018-05-25 (×3): qty 1

## 2018-05-25 MED ORDER — TAMSULOSIN HCL 0.4 MG PO CAPS
0.4000 mg | ORAL_CAPSULE | Freq: Every day | ORAL | Status: DC
  Administered 2018-05-25 – 2018-05-26 (×2): 0.4 mg via ORAL
  Filled 2018-05-25 (×2): qty 1

## 2018-05-25 MED ORDER — SODIUM CHLORIDE 0.9 % IV SOLN
INTRAVENOUS | Status: DC | PRN
  Administered 2018-05-25 – 2018-05-26 (×2): 500 mL via INTRAVENOUS

## 2018-05-25 MED ORDER — ACETAMINOPHEN 500 MG PO TABS
1000.0000 mg | ORAL_TABLET | Freq: Four times a day (QID) | ORAL | Status: DC | PRN
  Administered 2018-05-26 – 2018-05-27 (×4): 1000 mg via ORAL
  Filled 2018-05-25 (×4): qty 2

## 2018-05-25 MED ORDER — ALBUTEROL SULFATE (2.5 MG/3ML) 0.083% IN NEBU
5.0000 mg | INHALATION_SOLUTION | Freq: Once | RESPIRATORY_TRACT | Status: AC
  Administered 2018-05-25: 5 mg via RESPIRATORY_TRACT
  Filled 2018-05-25: qty 6

## 2018-05-25 MED ORDER — LOSARTAN POTASSIUM 50 MG PO TABS
50.0000 mg | ORAL_TABLET | Freq: Every day | ORAL | Status: DC
  Administered 2018-05-26 – 2018-05-27 (×2): 50 mg via ORAL
  Filled 2018-05-25 (×3): qty 1

## 2018-05-25 MED ORDER — IPRATROPIUM-ALBUTEROL 0.5-2.5 (3) MG/3ML IN SOLN
3.0000 mL | Freq: Four times a day (QID) | RESPIRATORY_TRACT | Status: DC
  Administered 2018-05-25: 3 mL via RESPIRATORY_TRACT

## 2018-05-25 NOTE — ED Triage Notes (Signed)
Patient is complaining of not being able to sleep and he has not improved from the second round of antibiotics from the pneumonia. Patient is not having fever or pain.

## 2018-05-25 NOTE — ED Notes (Signed)
ED TO INPATIENT HANDOFF REPORT  Name/Age/Gender Noah Martin 85 y.o. male  Code Status   Home/SNF/Other Home  Chief Complaint pneumonia  Level of Care/Admitting Diagnosis ED Disposition    ED Disposition Condition Comment   Admit  Hospital Area: [facility] [[id number]]  Level of Care: Med-Surg [16]  Diagnosis: Postobstructive pneumonia [295188]  Admitting Physician: Caren Griffins [id number]  Attending Physician: Caren Griffins [[id number]]  PT Class (Do Not Modify): Observation [104]  PT Acc Code (Do Not Modify): Observation [10022]       Medical History Past Medical History:  Diagnosis Date  . Acute renal insufficiency    postoperatively  . Anemia   . Aortic insufficiency    mild, -- with a markedly dilated aortic root  . Aortic stenosis    status post aortic valve replacement  . Atrial fibrillation ([place])    Intraoperative and postoperative atrial fibrillation  . Coronary artery disease    status post CABG x1 -- with endo vein to the RCA  . Hyperlipidemia   . Hypertension   . Nephrolithiasis   . Peptic ulcer    status post gastrectomy  . Pulmonary nodules    chronic -- 2008-2009 without change  . Right coronary artery occlusion ([facility])    Chronic occlusion of right coronary artery    Allergies No Known Allergies  IV Location/Drains/Wounds Patient Lines/Drains/Airways Status   Active Line/Drains/Airways    Name:   Placement date:   Placement time:   Site:   Days:   Peripheral IV 05/25/18 Left Forearm   05/25/18    0923    Forearm   less than 1          Labs/Imaging Results for orders placed or performed during the hospital encounter of 05/25/18 (from the past 48 hour(s))  CBC with Differential/Platelet     Status: Abnormal   Collection Time: 05/25/18  9:33 AM  Result Value Ref Range   WBC 11.2 (H) 4.0 - 10.5 K/uL   RBC 4.49 4.22 - 5.81 MIL/uL   Hemoglobin 12.6 (L) 13.0 - 17.0 g/dL   HCT 39.3 39.0 - 52.0 %   MCV 87.5 80.0 -  100.0 fL   MCH 28.1 26.0 - 34.0 pg   MCHC 32.1 30.0 - 36.0 g/dL   RDW 13.0 11.5 - 15.5 %   Platelets 294 150 - 400 K/uL   nRBC 0.0 0.0 - 0.2 %   Neutrophils Relative % 79 %   Neutro Abs 8.8 (H) 1.7 - 7.7 K/uL   Lymphocytes Relative 7 %   Lymphs Abs 0.7 0.7 - 4.0 K/uL   Monocytes Relative 13 %   Monocytes Absolute 1.4 (H) 0.1 - 1.0 K/uL   Eosinophils Relative 1 %   Eosinophils Absolute 0.1 0.0 - 0.5 K/uL   Basophils Relative 0 %   Basophils Absolute 0.1 0.0 - 0.1 K/uL   Immature Granulocytes 0 %   Abs Immature Granulocytes 0.05 0.00 - 0.07 K/uL    Comment: Performed at [facility], [place] [address], [place], [place] [address]  Comprehensive metabolic panel     Status: Abnormal   Collection Time: 05/25/18  9:33 AM  Result Value Ref Range   Sodium 135 135 - 145 mmol/L   Potassium 3.7 3.5 - 5.1 mmol/L   Chloride 104 98 - 111 mmol/L   CO2 22 22 - 32 mmol/L   Glucose, Bld 102 (H) 70 - 99 mg/dL   BUN 23 8 -  23 mg/dL   Creatinine, Ser 1.15 0.61 - 1.24 mg/dL   Calcium 8.9 8.9 - 10.3 mg/dL   Total Protein 7.2 6.5 - 8.1 g/dL   Albumin 3.2 (L) 3.5 - 5.0 g/dL   AST 48 (H) 15 - 41 U/L   ALT 36 0 - 44 U/L   Alkaline Phosphatase 66 38 - 126 U/L   Total Bilirubin 1.0 0.3 - 1.2 mg/dL   GFR calc non Af Amer 57 (L) >60 mL/min   GFR calc Af Amer >60 >60 mL/min   Anion gap 9 5 - 15    Comment: Performed at [facility], [place] [address], [place], [place] [address]   Dg Chest 2 View  Result Date: 05/23/2018 CLINICAL DATA:  Re-evaluate pneumonia. EXAM: CHEST - 2 VIEW COMPARISON:  05/09/2018 FINDINGS: Previous median sternotomy CABG procedure. Progressive airspace consolidation involving the right lower lobe identified. There is a small right pleural effusion which appears increased from previous exam. Left lung remains clear. IMPRESSION: 1. Worsening aeration to the right lower lobe compatible with progression of pneumonia. Followup PA and lateral chest X-ray is  recommended in 3-4 weeks following trial of antibiotic therapy to ensure resolution and exclude underlying malignancy. If this does not resolve further investigation with CT of the chest is advised to assess for underlying mass. Electronically Signed   By: Kerby Moors M.D.   On: 05/23/2018 15:54   Ct Chest W Contrast  Result Date: 05/25/2018 CLINICAL DATA:  85 year old male with a history of pneumonia, without fever or pain and no approve min after antibiotics EXAM: CT CHEST WITH CONTRAST TECHNIQUE: Multidetector CT imaging of the chest was performed during intravenous contrast administration. CONTRAST:  73mL OMNIPAQUE IOHEXOL 300 MG/ML  SOLN COMPARISON:  11/11/2007, 07/20/2006, 01/15/2006 FINDINGS: Cardiovascular: Heart size within normal limits. No pericardial fluid/thickening. Dense calcifications of the left main, left anterior descending, circumflex, right coronary arteries. Surgical changes of aortic valve repair and CABG. Median sternotomy. Calcifications of the aortic arch. Branch vessels are patent. Cervical cerebral vessels patent at the base of the neck. No dissection or aneurysm of descending thoracic aorta. Unremarkable diameter of the main pulmonary artery. Evaluation for filling defects limited by the contrast bolus timing. Mediastinum/Nodes: Multiple borderline enlarged lymph nodes throughout the mediastinum in all nodes stations. The index node in the lowest paratracheal station measures 16 mm. Subcarinal node measures 12 mm. Right hilar lymph nodes present. AP window nodes present. Lungs/Pleura: Confluent opacity involving the right middle lobe, with minimal aeration maintained of the right middle lobe. Confluent airspace opacity in the superior aspect of the right lower lobe with multiple bronchial airway occlusions including the right middle lobe, and the segments of the right lower lobe to the superior and medial segments. There is a more heterogeneously attenuating/enhancing region  associated with the stumped bronchus to the superior segment of the right lower lobe (image 94 of series 2. Significant interlobular septal thickening involving right middle lobe, right lower lobe, and the adjacent segments of the right upper lobe. Additionally there are small nodules along the proximal bronchi of the right upper lobe. These are most apparent on image 66, 70, 68 of series 5. Additional nodule of the right upper lobe image 82 of series 5. Upper Abdomen: New ill-defined heterogeneously attenuating nodule of segment 2 of the liver measuring 2.7 cm as well as a ill-defined heterogeneously attenuating lesion of segment 3 measuring 3.8 cm by 4.1 cm. The right aspect of the liver demonstrates decreased perfusion,  which may be flow related. Nodularity of the right adrenal gland, new from comparison chest CTs. Surgical changes at the epigastric region. Musculoskeletal: No acute displaced fracture. No aggressive lytic lesions are identified. 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 Referral for pulmonary evaluation and bronchoscopy recommended, as well as oncology referral. Confluent airspace disease of right middle lobe and right lower lobe, while most concerning for tumor involvement, could also represent a superimposed pneumonia/aspiration pneumonia. Bronchoscopy may be useful for differentiating infection/tumor. Trace right-sided pleural effusion. Surgical changes of prior median sternotomy, aortic valve replacement, and CABG. Electronically Signed   By: Corrie Mckusick D.O.   On: 05/25/2018 11:18   None  Pending Labs [employer] (From admission, onward)    Start     Ordered   Signed and Held  Comprehensive metabolic panel  Tomorrow morning,   R     Signed and Held   Signed and Held  CBC   Tomorrow morning,   R     Signed and Held          Vitals/Pain Today's Vitals   05/25/18 1000 05/25/18 1040 05/25/18 1236 05/25/18 1253  BP: (!) 113/53  118/78   Pulse: (!) 123 93 (!) 124 96  Resp: 17   18  Temp:      TempSrc:      SpO2: 95% 95% (!) 87% 96%  Weight:      Height:      PainSc: 0-No pain       Isolation Precautions No active isolations  Medications Medications  sodium chloride (PF) 0.9 % injection (has no administration in time range)  vancomycin (VANCOCIN) IVPB 1000 mg/200 mL premix (1,000 mg Intravenous New Bag/Given 05/25/18 1251)  piperacillin-tazobactam (ZOSYN) IVPB 3.375 g (has no administration in time range)  ipratropium-albuterol (DUONEB) 0.5-2.5 (3) MG/3ML nebulizer solution 3 mL (has no administration in time range)  albuterol (PROVENTIL) (2.5 MG/3ML) 0.083% nebulizer solution 5 mg (5 mg Nebulization Given 05/25/18 0906)  sodium chloride 0.9 % bolus 1,000 mL (0 mLs Intravenous Stopped 05/25/18 1237)  iohexol (OMNIPAQUE) 300 MG/ML solution 75 mL (75 mLs Intravenous Contrast Given 05/25/18 1047)  ceFEPIme (MAXIPIME) 1 g in sodium chloride 0.9 % 100 mL IVPB (1 g Intravenous New Bag/Given 05/25/18 1237)    Mobility walks with person assist

## 2018-05-25 NOTE — Progress Notes (Signed)
Called to get report from [place], [place]

## 2018-05-25 NOTE — ED Provider Notes (Signed)
[facility] DEPT Provider Note   CSN: [id number] Arrival date & time: 05/25/18  0630     History   Chief Complaint Chief Complaint  Patient presents with  . Cough  . Pneumonia    HPI Noah Martin is a 85 y.o. male.  HPI Patient is an 85 year old male with a recent diagnosis of pneumonia who is gone through a course of Levaquin and doxycycline as well as azithromycin without improvement in his symptoms.  He feels progressively short of breath and generally weak.  He feels like he is lost 15 pounds over the past year.  He had difficulty sleeping.  He states he feels slightly anxious.  He feels like his breathing is not improving.  Cough continues to be productive and present despite antibiotics.  No history of cancer.  Has felt weak and worn out.  His chest and abdominal pain   Past Medical History:  Diagnosis Date  . Acute renal insufficiency    postoperatively  . Anemia   . Aortic insufficiency    mild, -- with a markedly dilated aortic root  . Aortic stenosis    status post aortic valve replacement  . Atrial fibrillation ([place])    Intraoperative and postoperative atrial fibrillation  . Coronary artery disease    status post CABG x1 -- with endo vein to the RCA  . Hyperlipidemia   . Hypertension   . Nephrolithiasis   . Peptic ulcer    status post gastrectomy  . Pulmonary nodules    chronic -- 2008-2009 without change  . Right coronary artery occlusion [facility])    Chronic occlusion of right coronary artery    Patient Active Problem List   Diagnosis Date Noted  . S/P AVR (aortic valve replacement) 02/19/2014  . Hyperlipidemia   . Hypertension   . Aortic stenosis   . Coronary artery disease     Past Surgical History:  Procedure Laterality Date  . AORTIC VALVE REPLACEMENT  06/04/2008   Aortic valve replacement with a 25 mm model Sylvarena pericardial tissue valve, serial number [id number] -- Coronary artery bypass  grafting x1 with endovein  . BILROTH II PROCEDURE     anastomosis -- Peptic ulcer surgery  . CARDIAC CATHETERIZATION  05/19/2008   Two-vessel obstructive CAD involving small diagonal branch.  The RCA is occluded and has left-to-right collaterals --  Normal LV function -- Normal pulmonary artery pressures -- Moderate aortic stenosis -- Moderate aortic root enlargement with mild aortic insufficiency     . CORONARY STENT PLACEMENT  2000   stent placement in the circumflex coronary artery  . FINGER AMPUTATION     post partial amputation of right index finger        Home Medications    Prior to Admission medications   Medication Sig Start Date End Date Taking? Authorizing Provider  aspirin EC 81 MG tablet Take 1 tablet (81 mg total) by mouth daily. 07/30/17   Martinique, Peter M, MD  ezetimibe (ZETIA) 10 MG tablet Take 1 tablet (10 mg total) by mouth daily. 05/02/16   [place]ique, Peter M, MD  ezetimibe (ZETIA) 10 MG tablet TAKE 1 TABLET DAILY 03/13/18   Martinique, Peter M, MD  lansoprazole (PREVACID) 30 MG capsule TAKE ONE CAPSULE BY MOUTH DAILY 05/22/17   Martinique, Peter M, MD  simvastatin (ZOCOR) 40 MG tablet TAKE ONE TABLET BY MOUTH DAILY AT 6 PM Patient taking differently: Take 40 mg by mouth daily at 6 PM.  05/20/18   Martinique, Peter M, MD    Family History Family History  Problem Relation Age of Onset  . Heart attack Mother   . Coronary artery disease Brother        early onset / with CABG    Social History Social History   Tobacco Use  . Smoking status: Former Smoker    Packs/day: 1.00    Years: 14.00    Pack years: 14.00    Types: Cigarettes    Last attempt to quit: 12/04/1972    Years since quitting: 45.5  . Smokeless tobacco: Never Used  Substance Use Topics  . Alcohol use: No  . Drug use: No     Allergies   Other   Review of Systems Review of Systems  All other systems reviewed and are negative.    Physical Exam Updated Vital Signs BP 130/65 (BP Location: Left Arm)    Pulse 82   Temp 97.8 F (36.6 C) (Oral)   Resp (!) 21   Ht 5\' 9"  (1.753 m)   Wt 61.2 kg   SpO2 96%   BMI 19.94 kg/m   Physical Exam Vitals signs and nursing note reviewed.  Constitutional:      Appearance: He is well-developed.  HENT:     Head: Normocephalic and atraumatic.  Neck:     Musculoskeletal: Normal range of motion.  Cardiovascular:     Rate and Rhythm: Normal rate and regular rhythm.     Heart sounds: Normal heart sounds.  Pulmonary:     Effort: Pulmonary effort is normal. No respiratory distress.     Breath sounds: Normal breath sounds.  Abdominal:     General: There is no distension.     Palpations: Abdomen is soft.     Tenderness: There is no abdominal tenderness.  Musculoskeletal: Normal range of motion.  Skin:    General: Skin is warm and dry.  Neurological:     Mental Status: He is alert and oriented to person, place, and time.  Psychiatric:        Judgment: Judgment normal.      ED Treatments / Results  Labs (all labs ordered are listed, but only abnormal results are displayed) Labs Reviewed  CBC WITH DIFFERENTIAL/PLATELET - Abnormal; Notable for the following components:      Result Value   WBC 11.2 (*)    Hemoglobin 12.6 (*)    Neutro Abs 8.8 (*)    Monocytes Absolute 1.4 (*)    All other components within normal limits  COMPREHENSIVE METABOLIC PANEL - Abnormal; Notable for the following components:   Glucose, Bld 102 (*)    Albumin 3.2 (*)    AST 48 (*)    GFR calc non Af Amer 57 (*)    All other components within normal limits  COMPREHENSIVE METABOLIC PANEL - Abnormal; Notable for the following components:   Glucose, Bld 101 (*)    BUN 26 (*)    Creatinine, Ser 1.39 (*)    Calcium 8.4 (*)    Total Protein 5.6 (*)    Albumin 2.5 (*)    AST 69 (*)    GFR calc non Af Amer 46 (*)    GFR calc Af Amer 53 (*)    All other components within normal limits  CBC - Abnormal; Notable for the following components:   RBC 3.63 (*)     Hemoglobin 10.2 (*)    HCT 32.2 (*)    All other components within normal  limits    EKG None  Radiology Ct Chest W Contrast  Result Date: 05/25/2018 CLINICAL DATA:  86 year old male with a history of pneumonia, without fever or pain and no approve min after antibiotics EXAM: CT CHEST WITH CONTRAST TECHNIQUE: Multidetector CT imaging of the chest was performed during intravenous contrast administration. CONTRAST:  52mL OMNIPAQUE IOHEXOL 300 MG/ML  SOLN COMPARISON:  11/11/2007, 07/20/2006, 01/15/2006 FINDINGS: Cardiovascular: Heart size within normal limits. No pericardial fluid/thickening. Dense calcifications of the left main, left anterior descending, circumflex, right coronary arteries. Surgical changes of aortic valve repair and CABG. Median sternotomy. Calcifications of the aortic arch. Branch vessels are patent. Cervical cerebral vessels patent at the base of the neck. No dissection or aneurysm of descending thoracic aorta. Unremarkable diameter of the main pulmonary artery. Evaluation for filling defects limited by the contrast bolus timing. Mediastinum/Nodes: Multiple borderline enlarged lymph nodes throughout the mediastinum in all nodes stations. The index node in the lowest paratracheal station measures 16 mm. Subcarinal node measures 12 mm. Right hilar lymph nodes present. AP window nodes present. Lungs/Pleura: Confluent opacity involving the right middle lobe, with minimal aeration maintained of the right middle lobe. Confluent airspace opacity in the superior aspect of the right lower lobe with multiple bronchial airway occlusions including the right middle lobe, and the segments of the right lower lobe to the superior and medial segments. There is a more heterogeneously attenuating/enhancing region associated with the stumped bronchus to the superior segment of the right lower lobe (image 94 of series 2. Significant interlobular septal thickening involving right middle lobe, right lower  lobe, and the adjacent segments of the right upper lobe. Additionally there are small nodules along the proximal bronchi of the right upper lobe. These are most apparent on image 66, 70, 68 of series 5. Additional nodule of the right upper lobe image 82 of series 5. Upper Abdomen: New ill-defined heterogeneously attenuating nodule of segment 2 of the liver measuring 2.7 cm as well as a ill-defined heterogeneously attenuating lesion of segment 3 measuring 3.8 cm by 4.1 cm. The right aspect of the liver demonstrates decreased perfusion, which may be flow related. Nodularity of the right adrenal gland, new from comparison chest CTs. Surgical changes at the epigastric region. Musculoskeletal: No acute displaced fracture. No aggressive lytic lesions are identified. 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 Referral for pulmonary evaluation and bronchoscopy recommended, as well as oncology referral. Confluent airspace disease of right middle lobe and right lower lobe, while most concerning for tumor involvement, could also represent a superimposed pneumonia/aspiration pneumonia. Bronchoscopy may be useful for differentiating infection/tumor. Trace right-sided pleural effusion. Surgical changes of prior median sternotomy, aortic valve replacement, and CABG. Electronically Signed   By: Corrie Mckusick D.O.   On: 05/25/2018 11:18    Procedures Procedures (including critical care time)  Medications Ordered in ED Medications  sodium chloride (PF) 0.9 % injection (  See Procedure Record 05/25/18 1045)  acetaminophen (TYLENOL) tablet 1,000 mg (1,000 mg Oral Given 05/26/18 0148)  aspirin EC tablet 81 mg (81 mg Oral Given 05/25/18 1507)  ezetimibe (ZETIA) tablet 10 mg (10 mg Oral Given 05/25/18 1507)  losartan (COZAAR)  tablet 50 mg (has no administration in time range)  simvastatin (ZOCOR) tablet 40 mg (40 mg Oral Given 05/25/18 1754)  pantoprazole (PROTONIX) EC tablet 20 mg (20  mg Oral Given 05/25/18 1754)  tamsulosin (FLOMAX) capsule 0.4 mg (0.4 mg Oral Given 05/25/18 1507)  enoxaparin (LOVENOX) injection 40 mg (40 mg Subcutaneous Given 05/25/18 1507)  piperacillin-tazobactam (ZOSYN) IVPB 3.375 g (3.375 g Intravenous New Bag/Given 05/26/18 0145)  ipratropium-albuterol (DUONEB) 0.5-2.5 (3) MG/3ML nebulizer solution 3 mL (3 mLs Nebulization Given 05/25/18 2227)  0.9 %  sodium chloride infusion ( Intravenous Stopped 05/25/18 1808)  benzonatate (TESSALON) capsule 100 mg (100 mg Oral Given 05/26/18 0143)  albuterol (PROVENTIL) (2.5 MG/3ML) 0.083% nebulizer solution 5 mg (5 mg Nebulization Given 05/25/18 0906)  sodium chloride 0.9 % bolus 1,000 mL (0 mLs Intravenous Stopped 05/25/18 1237)  iohexol (OMNIPAQUE) 300 MG/ML solution 75 mL (75 mLs Intravenous Contrast Given 05/25/18 1047)  ceFEPIme (MAXIPIME) 1 g in sodium chloride 0.9 % 100 mL IVPB (0 g Intravenous Stopping Infusion hung by another clincian 05/25/18 1307)  vancomycin (VANCOCIN) IVPB 1000 mg/200 mL premix (0 mg Intravenous Stopping Infusion hung by another clincian 05/25/18 1351)     Initial Impression / Assessment and Plan / ED Course  I have reviewed the triage vital signs and the nursing notes.  Pertinent labs & imaging results that were available during my care of the patient were reviewed by me and considered in my medical decision making (see chart for details).     Despite appropriate antibiotics the patient's pneumonia sounds as though it is worsening clinically.  His chest x-ray has been progressive with a chest x-ray obtained 2 days ago demonstrating worsening right lower lobe pneumonia.  Given his generalized weakness and his pneumonia not responding to IV antibiotics he will undergo CT imaging of his chest at this time to evaluate the extent of  pneumonia and determine if there could be a component of postobstructive pneumonia.  IV fluids now.  Labs pending.  Patient and family updated as to plan.  Patient CT scan concerning for developing lung cancer with metastatic spread.  Much of the infiltrative process may be tumor burden related however I am still concerned about the possibility of a postobstructive pneumonia behind this.  IV antibiotics.  Admission the hospital for additional work-up.  Final Clinical Impressions(s) / ED Diagnoses   Final diagnoses:  HCAP (healthcare-associated pneumonia)  Mass of right lung    ED Discharge Orders    None       Jola Schmidt, MD 05/26/18 [phone]

## 2018-05-25 NOTE — Progress Notes (Signed)
Pharmacy Antibiotic Note  Noah Martin is a 85 y.o. male admitted on 05/25/2018 with pneumonia.  PMH significant for metastatic lung cancer.  Per chart review his PNA continues to worsen despite courses of Levaquin, Doxycycline and Azithromycin as an outpatient.  CT + aspiration PNA.   Pharmacy has been consulted for Zosyn dosing.  05/25/2018:  Afebrile  Mild leukocytosis  Renal function at patient's baseline, CrCl >62ml/min  O2 sat 87% on 2L O2  Plan: Zosyn 3.375gm IV Q8h to be infused over 4hrs Monitor renal function and cx data   Height: 5\' 9"  (175.3 cm) Weight: 135 lb (61.2 kg) IBW/kg (Calculated) : 70.7  Temp (24hrs), Avg:97.8 F (36.6 C), Min:97.8 F (36.6 C), Max:97.8 F (36.6 C)  Recent Labs  Lab 05/25/18 0933  WBC 11.2*  CREATININE 1.15    Estimated Creatinine Clearance: 39.9 mL/min (by C-G formula based on SCr of 1.15 mg/dL).    No Known Allergies  Antimicrobials this admission: 1/25 Cefepime x1 in ED 1/25 Zosyn >>   Dose adjustments this admission:  Microbiology results:  Thank you for allowing pharmacy to be a part of this patient's care.  Biagio Borg 05/25/2018 12:41 PM

## 2018-05-25 NOTE — H&P (Signed)
History and Physical    Noah Martin IRJ:[id number] DOB: 29-Jul-1929 DOA: 05/25/2018  I have briefly reviewed the patient's prior medical records in [place]  PCP: London Pepper, MD  Patient coming from: home  Chief Complaint: cough, shortness of breath   HPI: Noah Martin is a 85 y.o. male with medical history significant of coronary artery disease status post bypass surgery x1 as well as aortic valve replacement for aortic stenosis, hypertension, hyperlipidemia who presents to the hospital with chief complaint of persistent cough, chest congestion, that is been bothering him for several weeks.  He underwent outpatient evaluation, was placed on antibiotics for pneumonia, however despite taking those his symptoms have not resolved.  He has also become progressively more short of breath.  He is not able to sleep at night.  He denies any fever or chills, denies any abdominal pain, nausea vomiting or diarrhea.  He denies any chest pain.  He denies any palpitations.  He also mentions that over the last 12 months he has lost about 15 pounds, unintentionally  ED Course: In the ED he is afebrile, tachypneic, intermittently tachycardic.  He was found to be hypoxic requiring supplemental oxygen.  Blood work reveals mild leukocytosis of 11.2 but otherwise relatively unremarkable.  CT scan chest was concern for right lower lobe cancer/adenocarcinoma with metastasis to the right upper lobe, right middle lobe, mediastinal lymph nodes, liver and right adrenal gland.  There was also concern for superimposed pneumonia.  He was given antibiotics and we are asked to admit  Review of Systems: As per HPI otherwise 10 point review of systems negative.   Past Medical History:  Diagnosis Date  . Acute renal insufficiency    postoperatively  . Anemia   . Aortic insufficiency    mild, -- with a markedly dilated aortic root  . Aortic stenosis    status post aortic valve replacement  . Atrial  fibrillation ([place])    Intraoperative and postoperative atrial fibrillation  . Coronary artery disease    status post CABG x1 -- with endo vein to the RCA  . Hyperlipidemia   . Hypertension   . Nephrolithiasis   . Peptic ulcer    status post gastrectomy  . Pulmonary nodules    chronic -- 2008-2009 without change  . Right coronary artery occlusion [facility])    Chronic occlusion of right coronary artery    Past Surgical History:  Procedure Laterality Date  . AORTIC VALVE REPLACEMENT  06/04/2008   Aortic valve replacement with a 25 mm model [employer] pericardial tissue valve, serial number [id number] -- Coronary artery bypass grafting x1 with endovein  . BILROTH II PROCEDURE     anastomosis -- Peptic ulcer surgery  . CARDIAC CATHETERIZATION  05/19/2008   Two-vessel obstructive CAD involving small diagonal branch.  The RCA is occluded and has left-to-right collaterals --  Normal LV function -- Normal pulmonary artery pressures -- Moderate aortic stenosis -- Moderate aortic root enlargement with mild aortic insufficiency     . CORONARY STENT PLACEMENT  2000   stent placement in the circumflex coronary artery  . FINGER AMPUTATION     post partial amputation of right index finger     reports that he quit smoking about 45 years ago. His smoking use included cigarettes. He has a 14.00 pack-year smoking history. He has never used smokeless tobacco. He reports that he does not drink alcohol or use drugs.  No Known Allergies  Family History  Problem Relation Age of Onset  . Heart attack Mother   . Coronary artery disease Brother        early onset / with CABG    Prior to Admission medications   Medication Sig Start Date End Date Taking? Authorizing Provider  acetaminophen (TYLENOL) 500 MG tablet Take 1,000 mg by mouth every 6 (six) hours as needed for mild pain or headache.   Yes [provider]  amoxicillin-clavulanate (AUGMENTIN) 875-125 MG tablet Take 1 tablet by  mouth 2 (two) times daily. 05/23/18  Yes [provider]  aspirin EC 81 MG tablet Take 1 tablet (81 mg total) by mouth daily. 07/30/17  Yes Martinique, Peter M, MD  azithromycin (ZITHROMAX) 250 MG tablet Take 250-500 mg by mouth See admin instructions. Take 500 mg by mouth on day 1 then take 250 mg by mouth on days 2-5 05/23/18  Yes [provider]  benzonatate (TESSALON) 100 MG capsule Take 100 mg by mouth every 8 (eight) hours as needed for cough.  05/23/18  Yes [provider]  ezetimibe (ZETIA) 10 MG tablet Take 1 tablet (10 mg total) by mouth daily. 05/02/16  Yes Martinique, Peter M, MD  lansoprazole (PREVACID) 30 MG capsule TAKE ONE CAPSULE BY MOUTH DAILY Patient taking differently: Take 30 mg by mouth daily.  05/22/17  Yes Martinique, Peter M, MD  losartan (COZAAR) 50 MG tablet Take 50 mg by mouth daily.   Yes [provider]  simvastatin (ZOCOR) 40 MG tablet TAKE ONE TABLET BY MOUTH DAILY AT 6 PM Patient taking differently: Take 40 mg by mouth daily at 6 PM.  05/20/18  Yes Martinique, Peter M, MD  tamsulosin (FLOMAX) 0.4 MG CAPS capsule Take 0.4 mg by mouth daily. 05/09/18  Yes [provider]  ezetimibe (ZETIA) 10 MG tablet TAKE 1 TABLET DAILY Patient not taking: Reported on 05/25/2018 03/13/18   Martinique, Peter M, MD    Physical Exam: Vitals:   05/25/18 0928 05/25/18 1000 05/25/18 1040 05/25/18 1236  BP: 133/67 (!) 113/53  118/78  Pulse: 76 (!) 123 93 (!) 124  Resp: 20 17    Temp:      TempSrc:      SpO2: 95% 95% 95% (!) 87%  Weight:      Height:        Constitutional: NAD, a bit anxious Eyes: PERRL, lids and conjunctivae normal ENMT: Mucous membranes are moist.  Neck: normal, supple Respiratory: Rhonchi in the right lower lung, no crackles, no wheezing heard Cardiovascular: Regular rate and rhythm, no murmurs / rubs / gallops. No extremity edema. 2+ pedal pulses.  Slightly tachycardic Abdomen: no tenderness, no masses palpated. Bowel sounds positive.    Musculoskeletal: no clubbing / cyanosis.  Skin: no rashes Neurologic: CN 2-12 grossly intact. Strength 5/5 in all 4.  Psychiatric: Normal judgment and insight. Alert and oriented x 3. Normal mood.   Labs on Admission: I have personally reviewed following labs and imaging studies  CBC: Recent Labs  Lab 05/25/18 0933  WBC 11.2*  NEUTROABS 8.8*  HGB 12.6*  HCT 39.3  MCV 87.5  PLT 409   Basic Metabolic Panel: Recent Labs  Lab 05/25/18 0933  NA 135  K 3.7  CL 104  CO2 22  GLUCOSE 102*  BUN 23  CREATININE 1.15  CALCIUM 8.9   GFR: Estimated Creatinine Clearance: 39.9 mL/min (by C-G formula based on SCr of 1.15 mg/dL). Liver Function Tests: Recent Labs  Lab 05/25/18 0933  AST 48*  ALT 36  ALKPHOS 66  BILITOT 1.0  PROT 7.2  ALBUMIN 3.2*   No results for input(s): LIPASE, AMYLASE in the last 168 hours. No results for input(s): AMMONIA in the last 168 hours. Coagulation Profile: No results for input(s): INR, PROTIME in the last 168 hours. Cardiac Enzymes: No results for input(s): CKTOTAL, CKMB, CKMBINDEX, TROPONINI in the last 168 hours. BNP (last 3 results) No results for input(s): PROBNP in the last 8760 hours. HbA1C: No results for input(s): HGBA1C in the last 72 hours. CBG: No results for input(s): GLUCAP in the last 168 hours. Lipid Profile: No results for input(s): CHOL, HDL, LDLCALC, TRIG, CHOLHDL, LDLDIRECT in the last 72 hours. Thyroid Function Tests: No results for input(s): TSH, T4TOTAL, FREET4, T3FREE, THYROIDAB in the last 72 hours. Anemia Panel: No results for input(s): VITAMINB12, FOLATE, FERRITIN, TIBC, IRON, RETICCTPCT in the last 72 hours. Urine analysis:    Component Value Date/Time   COLORURINE YELLOW 05/29/2008 1251   APPEARANCEUR CLEAR 05/29/2008 1251   LABSPEC 1.014 05/29/2008 1251   PHURINE 5.5 05/29/2008 1251   GLUCOSEU NEGATIVE 05/29/2008 1251   HGBUR NEGATIVE 05/29/2008 1251   BILIRUBINUR NEGATIVE 05/29/2008 1251   KETONESUR  NEGATIVE 05/29/2008 1251   PROTEINUR NEGATIVE 05/29/2008 1251   UROBILINOGEN 0.2 05/29/2008 1251   NITRITE NEGATIVE 05/29/2008 1251   LEUKOCYTESUR  05/29/2008 1251    NEGATIVE MICROSCOPIC NOT DONE ON URINES WITH NEGATIVE PROTEIN, BLOOD, LEUKOCYTES, NITRITE, OR GLUCOSE <1000 mg/dL.     Radiological Exams on Admission: Dg Chest 2 View  Result Date: 05/23/2018 CLINICAL DATA:  Re-evaluate pneumonia. EXAM: CHEST - 2 VIEW COMPARISON:  05/09/2018 FINDINGS: Previous median sternotomy CABG procedure. Progressive airspace consolidation involving the right lower lobe identified. There is a small right pleural effusion which appears increased from previous exam. Left lung remains clear. IMPRESSION: 1. Worsening aeration to the right lower lobe compatible with progression of pneumonia. Followup PA and lateral chest X-ray is recommended in 3-4 weeks following trial of antibiotic therapy to ensure resolution and exclude underlying malignancy. If this does not resolve further investigation with CT of the chest is advised to assess for underlying mass. Electronically Signed   By: Kerby Moors M.D.   On: 05/23/2018 15:54   Ct Chest W Contrast  Result Date: 05/25/2018 CLINICAL DATA:  85 year old male with a history of pneumonia, without fever or pain and no approve min after antibiotics EXAM: CT CHEST WITH CONTRAST TECHNIQUE: Multidetector CT imaging of the chest was performed during intravenous contrast administration. CONTRAST:  74mL OMNIPAQUE IOHEXOL 300 MG/ML  SOLN COMPARISON:  11/11/2007, 07/20/2006, 01/15/2006 FINDINGS: Cardiovascular: Heart size within normal limits. No pericardial fluid/thickening. Dense calcifications of the left main, left anterior descending, circumflex, right coronary arteries. Surgical changes of aortic valve repair and CABG. Median sternotomy. Calcifications of the aortic arch. Branch vessels are patent. Cervical cerebral vessels patent at the base of the neck. No dissection or aneurysm  of descending thoracic aorta. Unremarkable diameter of the main pulmonary artery. Evaluation for filling defects limited by the contrast bolus timing. Mediastinum/Nodes: Multiple borderline enlarged lymph nodes throughout the mediastinum in all nodes stations. The index node in the lowest paratracheal station measures 16 mm. Subcarinal node measures 12 mm. Right hilar lymph nodes present. AP window nodes present. Lungs/Pleura: Confluent opacity involving the right middle lobe, with minimal aeration maintained of the right middle lobe. Confluent airspace opacity in the superior aspect of the right lower lobe with multiple bronchial airway occlusions including the right middle lobe,  and the segments of the right lower lobe to the superior and medial segments. There is a more heterogeneously attenuating/enhancing region associated with the stumped bronchus to the superior segment of the right lower lobe (image 94 of series 2. Significant interlobular septal thickening involving right middle lobe, right lower lobe, and the adjacent segments of the right upper lobe. Additionally there are small nodules along the proximal bronchi of the right upper lobe. These are most apparent on image 66, 70, 68 of series 5. Additional nodule of the right upper lobe image 82 of series 5. Upper Abdomen: New ill-defined heterogeneously attenuating nodule of segment 2 of the liver measuring 2.7 cm as well as a ill-defined heterogeneously attenuating lesion of segment 3 measuring 3.8 cm by 4.1 cm. The right aspect of the liver demonstrates decreased perfusion, which may be flow related. Nodularity of the right adrenal gland, new from comparison chest CTs. Surgical changes at the epigastric region. Musculoskeletal: No acute displaced fracture. No aggressive lytic lesions are identified. 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 Referral for pulmonary evaluation and bronchoscopy recommended, as well as oncology referral. Confluent airspace disease of right middle lobe and right lower lobe, while most concerning for tumor involvement, could also represent a superimposed pneumonia/aspiration pneumonia. Bronchoscopy may be useful for differentiating infection/tumor. Trace right-sided pleural effusion. Surgical changes of prior median sternotomy, aortic valve replacement, and CABG. Electronically Signed   By: Corrie Mckusick D.O.   On: 05/25/2018 11:18    EKG: Independently reviewed.  EKG pending  Assessment/Plan Active Problems:   Postobstructive pneumonia   Principal Problem Acute hypoxic respiratory failure in the setting of probable postobstructive pneumonia -Admit to the hospital, placed on oxygen to maintain O2 sats above 90%. -Started Zosyn, continue, may need a little bit longer course of Augmentin on discharge  Active Problems New onset probable lung malignancy -Consulted pulmonary, discussed with Dr. Ander Slade, will see in consultation.  May benefit from bronc, I do not see readily accessible lymph nodes for biopsy -Probably need to be set up with oncology, PET scan and so on once tissue diagnosis is obtained -Patient confirms DNR and tells me he wants to find a diagnosis and options available to him but he will not "suffer" and be subject to "torture".  He tells me that he would likely not to undergo very aggressive treatment regimens but does want to find out what this is and what are his options moving forward -He smoked briefly when he was younger but not a long-term smoker  Hypertension -Continue Cozaar  Hyperlipidemia -Continue simvastatin  Coronary artery disease -No chest pain, continue aspirin, statin   DVT prophylaxis: Lovenox  Code Status: DNR  Family Communication: daughter in the room Disposition  Plan: admit to medsurg Consults called: Pulmonary, d/w Dr. Alcide Clever, MD, PhD [facility]  Contact via [email or web address]  [facility] Office Info P: [phone]  F: [phone]   05/25/2018, 12:40 PM

## 2018-05-25 NOTE — Progress Notes (Signed)
A consult was received from an ED physician for vancomycin per pharmacy dosing.  The patient's profile has been reviewed for ht/wt/allergies/indication/available labs.   A one time order has been placed for 1g.  Further antibiotics/pharmacy consults should be ordered by admitting physician if indicated.                       Thank you, Romeo Rabon, PharmD. Mobile: [phone]. 05/25/2018,11:11 AM.

## 2018-05-26 DIAGNOSIS — C787 Secondary malignant neoplasm of liver and intrahepatic bile duct: Secondary | ICD-10-CM | POA: Diagnosis present

## 2018-05-26 DIAGNOSIS — E785 Hyperlipidemia, unspecified: Secondary | ICD-10-CM | POA: Diagnosis present

## 2018-05-26 DIAGNOSIS — I2510 Atherosclerotic heart disease of native coronary artery without angina pectoris: Secondary | ICD-10-CM | POA: Diagnosis present

## 2018-05-26 DIAGNOSIS — Z955 Presence of coronary angioplasty implant and graft: Secondary | ICD-10-CM | POA: Diagnosis not present

## 2018-05-26 DIAGNOSIS — J9601 Acute respiratory failure with hypoxia: Secondary | ICD-10-CM | POA: Diagnosis present

## 2018-05-26 DIAGNOSIS — C3431 Malignant neoplasm of lower lobe, right bronchus or lung: Secondary | ICD-10-CM | POA: Diagnosis not present

## 2018-05-26 DIAGNOSIS — Z87891 Personal history of nicotine dependence: Secondary | ICD-10-CM | POA: Diagnosis not present

## 2018-05-26 DIAGNOSIS — I4891 Unspecified atrial fibrillation: Secondary | ICD-10-CM | POA: Diagnosis present

## 2018-05-26 DIAGNOSIS — Z66 Do not resuscitate: Secondary | ICD-10-CM | POA: Diagnosis present

## 2018-05-26 DIAGNOSIS — R846 Abnormal cytological findings in specimens from respiratory organs and thorax: Secondary | ICD-10-CM | POA: Diagnosis not present

## 2018-05-26 DIAGNOSIS — E440 Moderate protein-calorie malnutrition: Secondary | ICD-10-CM | POA: Diagnosis present

## 2018-05-26 DIAGNOSIS — C7971 Secondary malignant neoplasm of right adrenal gland: Secondary | ICD-10-CM | POA: Diagnosis present

## 2018-05-26 DIAGNOSIS — Z681 Body mass index (BMI) 19 or less, adult: Secondary | ICD-10-CM | POA: Diagnosis not present

## 2018-05-26 DIAGNOSIS — Z951 Presence of aortocoronary bypass graft: Secondary | ICD-10-CM | POA: Diagnosis not present

## 2018-05-26 DIAGNOSIS — I10 Essential (primary) hypertension: Secondary | ICD-10-CM | POA: Diagnosis present

## 2018-05-26 DIAGNOSIS — Z89021 Acquired absence of right finger(s): Secondary | ICD-10-CM | POA: Diagnosis not present

## 2018-05-26 DIAGNOSIS — Z953 Presence of xenogenic heart valve: Secondary | ICD-10-CM | POA: Diagnosis not present

## 2018-05-26 DIAGNOSIS — J189 Pneumonia, unspecified organism: Secondary | ICD-10-CM | POA: Diagnosis not present

## 2018-05-26 DIAGNOSIS — N400 Enlarged prostate without lower urinary tract symptoms: Secondary | ICD-10-CM | POA: Diagnosis present

## 2018-05-26 DIAGNOSIS — R9389 Abnormal findings on diagnostic imaging of other specified body structures: Secondary | ICD-10-CM

## 2018-05-26 DIAGNOSIS — R918 Other nonspecific abnormal finding of lung field: Secondary | ICD-10-CM | POA: Diagnosis not present

## 2018-05-26 DIAGNOSIS — C3490 Malignant neoplasm of unspecified part of unspecified bronchus or lung: Secondary | ICD-10-CM | POA: Diagnosis not present

## 2018-05-26 DIAGNOSIS — Z7982 Long term (current) use of aspirin: Secondary | ICD-10-CM | POA: Diagnosis not present

## 2018-05-26 DIAGNOSIS — Z79899 Other long term (current) drug therapy: Secondary | ICD-10-CM | POA: Diagnosis not present

## 2018-05-26 LAB — CBC
HCT: 32.2 % — ABNORMAL LOW (ref 39.0–52.0)
Hemoglobin: 10.2 g/dL — ABNORMAL LOW (ref 13.0–17.0)
MCH: 28.1 pg (ref 26.0–34.0)
MCHC: 31.7 g/dL (ref 30.0–36.0)
MCV: 88.7 fL (ref 80.0–100.0)
PLATELETS: 232 10*3/uL (ref 150–400)
RBC: 3.63 MIL/uL — ABNORMAL LOW (ref 4.22–5.81)
RDW: 13.2 % (ref 11.5–15.5)
WBC: 8.4 10*3/uL (ref 4.0–10.5)
nRBC: 0 % (ref 0.0–0.2)

## 2018-05-26 LAB — COMPREHENSIVE METABOLIC PANEL
ALT: 44 U/L (ref 0–44)
AST: 69 U/L — ABNORMAL HIGH (ref 15–41)
Albumin: 2.5 g/dL — ABNORMAL LOW (ref 3.5–5.0)
Alkaline Phosphatase: 58 U/L (ref 38–126)
Anion gap: 7 (ref 5–15)
BUN: 26 mg/dL — ABNORMAL HIGH (ref 8–23)
CALCIUM: 8.4 mg/dL — AB (ref 8.9–10.3)
CO2: 22 mmol/L (ref 22–32)
Chloride: 108 mmol/L (ref 98–111)
Creatinine, Ser: 1.39 mg/dL — ABNORMAL HIGH (ref 0.61–1.24)
GFR calc non Af Amer: 46 mL/min — ABNORMAL LOW (ref >60)
GFR, EST AFRICAN AMERICAN: 53 mL/min — AB (ref >60)
Glucose, Bld: 101 mg/dL — ABNORMAL HIGH (ref 70–99)
Potassium: 4.1 mmol/L (ref 3.5–5.1)
Sodium: 137 mmol/L (ref 135–145)
Total Bilirubin: 0.8 mg/dL (ref 0.3–1.2)
Total Protein: 5.6 g/dL — ABNORMAL LOW (ref 6.5–8.1)

## 2018-05-26 LAB — PROCALCITONIN: Procalcitonin: 0.2 ng/mL

## 2018-05-26 MED ORDER — SODIUM CHLORIDE 0.9 % IV SOLN: Freq: Once | INTRAVENOUS | Status: DC

## 2018-05-26 MED ORDER — PROMETHAZINE-CODEINE 6.25-10 MG/5ML PO SYRP
5.0000 mL | ORAL_SOLUTION | Freq: Four times a day (QID) | ORAL | Status: DC | PRN
  Administered 2018-05-26 (×2): 5 mL via ORAL
  Filled 2018-05-26 (×2): qty 5

## 2018-05-26 MED ORDER — BENZONATATE 100 MG PO CAPS
100.0000 mg | ORAL_CAPSULE | Freq: Three times a day (TID) | ORAL | Status: DC | PRN
  Administered 2018-05-26 – 2018-05-27 (×3): 100 mg via ORAL
  Filled 2018-05-26 (×3): qty 1

## 2018-05-26 MED ORDER — IPRATROPIUM-ALBUTEROL 0.5-2.5 (3) MG/3ML IN SOLN
3.0000 mL | Freq: Two times a day (BID) | RESPIRATORY_TRACT | Status: DC
  Administered 2018-05-26: 3 mL via RESPIRATORY_TRACT
  Filled 2018-05-26 (×3): qty 3

## 2018-05-26 MED ORDER — LIDOCAINE HCL 2 % EX GEL: 1.0000 "application " | Freq: Once | CUTANEOUS | Status: DC

## 2018-05-26 MED ORDER — LIDOCAINE HCL URETHRAL/MUCOSAL 2 % EX GEL: 1.0000 "application " | Freq: Once | CUTANEOUS | Status: DC

## 2018-05-26 MED ORDER — PHENYLEPHRINE HCL 0.25 % NA SOLN: 1.0000 | Freq: Four times a day (QID) | NASAL | Status: DC | PRN

## 2018-05-26 NOTE — Progress Notes (Signed)
PROGRESS NOTE    Noah Martin  JSH:[id number] DOB: 1929/12/29 DOA: 05/25/2018 PCP: London Pepper, MD   Brief Narrative:  85 year old with past medical history relevant for coronary artery disease/aortic stenosis status post one-vessel bypass and bioprosthetic AV valve replacement in 2010, hypertension, hyperlipidemia, BPH who presented to the ED with persistent weakness, cough, apparent failure of outpatient treatment of pneumonia and was found to have likely metastatic lung cancer as well as postobstructive pneumonia.   Assessment & Plan:   Active Problems:   Postobstructive pneumonia   #) Postobstructive pneumonia: CT scan shows evidence of consolidation concerning for either tumor or pneumonia. -Blood cultures on 05/25/2018 pending -Continue IV Zosyn started 05/25/2018 -We will order procalcitonin -Suspect patient can most likely be discharged on Augmentin  #) Widely metastatic lung cancer: CT scan on admission showed evidence of right lower lobe adenocarcinoma with lymphangitic carcinomatosis with lymph nodes in the mediastinum as well as metastases to the liver and right adrenal gland. -Pulmonology consulted, will plan for bronchoscopy on 05/28/2018 -Outpatient oncology follow-up  #) Hypertension/hyperlipidemia:-Continue simvastatin 40 mg nightly -Continue Zetia - Continue losartan 50 mg daily  #) BPH: -Continue tamsulosin 0.4 mg daily  #) Status post bioprosthetic AVR/one-vessel CABG: -Continue aspirin 81 mg -Continue ARB -Continue statin  Fluids: Tolerating p.o. Electrolytes: Monitor and supplement Nutrition: Heart healthy diet  Prophylaxis: Enoxaparin  Disposition: Pending bronchoscopy with biopsy  DO NOT RESUSCITATE    Consultants:   Pulmonology  Procedures:   None  Antimicrobials:  IV Zosyn started 05/25/2018   Subjective: Patient reports he is doing well this morning.  He denies any nausea, vomiting, shortness of breath.  His primary  complaint is cough.  Chest pain, lower extremity edema, night sweats, fevers.  Does report a 15 pound weight loss over several months.  Objective: Vitals:   05/25/18 1348 05/25/18 2118 05/26/18 0613 05/26/18 0741  BP: 123/62 (!) 108/54 (!) 115/57   Pulse: 100 64 68   Resp: 16 18 19    Temp: 98.3 F (36.8 C) 97.9 F (36.6 C) 98.2 F (36.8 C)   TempSrc: Oral Oral Oral   SpO2: 93% 94% 90% 91%  Weight: 63.5 kg     Height: 5\' 9"  (1.753 m)       Intake/Output Summary (Last 24 hours) at 05/26/2018 1044 Last data filed at 05/26/2018 0825 Gross per 24 hour  Intake 1048.23 ml  Output 500 ml  Net 548.23 ml   Filed Weights   05/25/18 0641 05/25/18 1348  Weight: 61.2 kg 63.5 kg    Examination:  General exam: Appears calm and comfortable  Respiratory system: No increased work of breathing, diminished lung sounds on right with scattered crackles, no wheezes or rhonchi Cardiovascular system: Regular rate and rhythm, no murmurs Gastrointestinal system: Soft, nondistended, no rebound or guarding, plus bowel sounds Central nervous system: Alert and oriented.  Grossly intact, moving all extremities. Extremities: No lower extremity edema Skin: Well-healed midline incision over chest and abdomen Psychiatry: Judgement and insight appear normal. Mood & affect appropriate.     Data Reviewed: I have personally reviewed following labs and imaging studies  CBC: Recent Labs  Lab 05/25/18 0933 05/26/18 0506  WBC 11.2* 8.4  NEUTROABS 8.8*  --   HGB 12.6* 10.2*  HCT 39.3 32.2*  MCV 87.5 88.7  PLT 294 850   Basic Metabolic Panel: Recent Labs  Lab 05/25/18 0933 05/26/18 0506  NA 135 137  K 3.7 4.1  CL 104 108  CO2 22 22  GLUCOSE  102* 101*  BUN 23 26*  CREATININE 1.15 1.39*  CALCIUM 8.9 8.4*   GFR: Estimated Creatinine Clearance: 34.3 mL/min (A) (by C-G formula based on SCr of 1.39 mg/dL (H)). Liver Function Tests: Recent Labs  Lab 05/25/18 0933 05/26/18 0506  AST 48* 69*    ALT 36 44  ALKPHOS 66 58  BILITOT 1.0 0.8  PROT 7.2 5.6*  ALBUMIN 3.2* 2.5*   No results for input(s): LIPASE, AMYLASE in the last 168 hours. No results for input(s): AMMONIA in the last 168 hours. Coagulation Profile: No results for input(s): INR, PROTIME in the last 168 hours. Cardiac Enzymes: No results for input(s): CKTOTAL, CKMB, CKMBINDEX, TROPONINI in the last 168 hours. BNP (last 3 results) No results for input(s): PROBNP in the last 8760 hours. HbA1C: No results for input(s): HGBA1C in the last 72 hours. CBG: No results for input(s): GLUCAP in the last 168 hours. Lipid Profile: No results for input(s): CHOL, HDL, LDLCALC, TRIG, CHOLHDL, LDLDIRECT in the last 72 hours. Thyroid Function Tests: No results for input(s): TSH, T4TOTAL, FREET4, T3FREE, THYROIDAB in the last 72 hours. Anemia Panel: No results for input(s): VITAMINB12, FOLATE, FERRITIN, TIBC, IRON, RETICCTPCT in the last 72 hours. Sepsis Labs: No results for input(s): PROCALCITON, LATICACIDVEN in the last 168 hours.  No results found for this or any previous visit (from the past 240 hour(s)).       Radiology Studies: Ct Chest W Contrast  Result Date: 05/25/2018 CLINICAL DATA:  85 year old male with a history of pneumonia, without fever or pain and no approve min after antibiotics EXAM: CT CHEST WITH CONTRAST TECHNIQUE: Multidetector CT imaging of the chest was performed during intravenous contrast administration. CONTRAST:  40mL OMNIPAQUE IOHEXOL 300 MG/ML  SOLN COMPARISON:  11/11/2007, 07/20/2006, 01/15/2006 FINDINGS: Cardiovascular: Heart size within normal limits. No pericardial fluid/thickening. Dense calcifications of the left main, left anterior descending, circumflex, right coronary arteries. Surgical changes of aortic valve repair and CABG. Median sternotomy. Calcifications of the aortic arch. Branch vessels are patent. Cervical cerebral vessels patent at the base of the neck. No dissection or aneurysm  of descending thoracic aorta. Unremarkable diameter of the main pulmonary artery. Evaluation for filling defects limited by the contrast bolus timing. Mediastinum/Nodes: Multiple borderline enlarged lymph nodes throughout the mediastinum in all nodes stations. The index node in the lowest paratracheal station measures 16 mm. Subcarinal node measures 12 mm. Right hilar lymph nodes present. AP window nodes present. Lungs/Pleura: Confluent opacity involving the right middle lobe, with minimal aeration maintained of the right middle lobe. Confluent airspace opacity in the superior aspect of the right lower lobe with multiple bronchial airway occlusions including the right middle lobe, and the segments of the right lower lobe to the superior and medial segments. There is a more heterogeneously attenuating/enhancing region associated with the stumped bronchus to the superior segment of the right lower lobe (image 94 of series 2. Significant interlobular septal thickening involving right middle lobe, right lower lobe, and the adjacent segments of the right upper lobe. Additionally there are small nodules along the proximal bronchi of the right upper lobe. These are most apparent on image 66, 70, 68 of series 5. Additional nodule of the right upper lobe image 82 of series 5. Upper Abdomen: New ill-defined heterogeneously attenuating nodule of segment 2 of the liver measuring 2.7 cm as well as a ill-defined heterogeneously attenuating lesion of segment 3 measuring 3.8 cm by 4.1 cm. The right aspect of the liver demonstrates decreased perfusion, which  may be flow related. Nodularity of the right adrenal gland, new from comparison chest CTs. Surgical changes at the epigastric region. Musculoskeletal: No acute displaced fracture. No aggressive lytic lesions are identified. 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 Referral for pulmonary evaluation and bronchoscopy recommended, as well as oncology referral. Confluent airspace disease of right middle lobe and right lower lobe, while most concerning for tumor involvement, could also represent a superimposed pneumonia/aspiration pneumonia. Bronchoscopy may be useful for differentiating infection/tumor. Trace right-sided pleural effusion. Surgical changes of prior median sternotomy, aortic valve replacement, and CABG. Electronically Signed   By: Corrie Mckusick D.O.   On: 05/25/2018 11:18        Scheduled Meds: . aspirin EC  81 mg Oral Daily  . enoxaparin (LOVENOX) injection  40 mg Subcutaneous Q24H  . ezetimibe  10 mg Oral Daily  . ipratropium-albuterol  3 mL Nebulization BID  . losartan  50 mg Oral Daily  . pantoprazole  20 mg Oral Daily  . simvastatin  40 mg Oral q1800  . tamsulosin  0.4 mg Oral Daily   Continuous Infusions: . sodium chloride Stopped (05/25/18 1808)  . piperacillin-tazobactam (ZOSYN)  IV 3.375 g (05/26/18 0925)     LOS: 0 days    Time spent: 35    Cristy Folks, MD [facility]  If 7PM-7AM, please contact night-coverage [email or web address] Password [facility] 05/26/2018, 10:44 AM

## 2018-05-26 NOTE — Consult Note (Signed)
NAME:  Noah Martin, MRN:  [id number], DOB:  01/08/1930, LOS: 0 ADMISSION DATE:  05/25/2018, CONSULTATION DATE: 05/25/2018 REFERRING MD: Dr. Gloris Ham, CHIEF COMPLAINT: Shortness of breath, abnormal CT scan of the chest  Brief History   Patient was admitted to the hospital with worsening shortness of breath with a protracted cough  History of present illness   Cough which is had for many years, taking off ACE inhibitors but cough persisted Symptoms have been ongoing for few weeks Was treated in the outpatient setting for possible pneumonia, nonresolution of symptoms led to presentation to the hospital Is not feeling acutely ill at present  He has lost some weight about 15 pounds in the last 12 months Denies hemoptysis  Past Medical History   Past Medical History:  Diagnosis Date  . Acute renal insufficiency    postoperatively  . Anemia   . Aortic insufficiency    mild, -- with a markedly dilated aortic root  . Aortic stenosis    status post aortic valve replacement  . Atrial fibrillation ([place])    Intraoperative and postoperative atrial fibrillation  . Coronary artery disease    status post CABG x1 -- with endo vein to the RCA  . Hyperlipidemia   . Hypertension   . Nephrolithiasis   . Peptic ulcer    status post gastrectomy  . Pulmonary nodules    chronic -- 2008-2009 without change  . Right coronary artery occlusion (HCC)    Chronic occlusion of right coronary artery   Significant Hospital Events   Unremarkable Productive cough-yellow phlegm, no fevers  Consults:  PCCM  Procedures:  None  Significant Diagnostic Tests:  CT scan of the chest-extensive infiltrative process, mediastinal adenopathy, right lung masslike lesion, endobronchial narrowing  Micro Data:  None   Antimicrobials:  Zosyn 1/25>> Vancomycin 1/25>>  Interim history/subjective:  Feels better, comfortable, on room air  Objective   Blood pressure (!) 115/57, pulse 68, temperature 98.2 F  (36.8 C), temperature source Oral, resp. rate 19, height 5\' 9"  (1.753 m), weight 63.5 kg, SpO2 91 %.        Intake/Output Summary (Last 24 hours) at 05/26/2018 1147 Last data filed at 05/26/2018 0825 Gross per 24 hour  Intake 1048.23 ml  Output 500 ml  Net 548.23 ml   Filed Weights   05/25/18 0641 05/25/18 1348  Weight: 61.2 kg 63.5 kg    Examination: General: Elderly gentleman, does appear stated age HENT: Moist oral mucosa Lungs: Decreased air entry at the bases, some rales on the right Cardiovascular: S1-S2 appreciated Abdomen: Soft, bowel sounds appreciated, no hepatosplenomegaly Extremities: No clubbing, no edema Neuro: Alert and oriented x3 GU: [facility] Hospital Problem list     Assessment & Plan:  Abnormal CT scan of the chest -Extensive consolidative changes -Deformities of airway is more consistent with a growth -Extensive mediastinal adenopathy -Concern for neoplastic process is high  -This was discussed with the patient and his daughter at bedside -In agreement with bronchoscopy  Failed outpatient treatment for pneumonia -Continue antibiotics at the present time Possibility of postobstructive pneumonia  -Currently on Zosyn and vancomycin -Has no leukocytosis  Hypertension -Continue antihypertensives  Hyperlipidemia Coronary artery disease   Best practice:  Diet: Regular Pain/Anxiety/Delirium protocol (if indicated): Not applicable VAP protocol (if indicated): Not applicable DVT prophylaxis: Enoxaparin GI prophylaxis: Protonix Glucose control: Not applicable Mobility: As tolerated Code Status: Full code Family Communication: Family member at bedside Disposition: We will plan for bronchoscopy Timeline will probably  be closer to Tuesday, 05/28/2018  Labs   CBC: Recent Labs  Lab 05/25/18 0933 05/26/18 0506  WBC 11.2* 8.4  NEUTROABS 8.8*  --   HGB 12.6* 10.2*  HCT 39.3 32.2*  MCV 87.5 88.7  PLT 294 932    Basic Metabolic  Panel: Recent Labs  Lab 05/25/18 0933 05/26/18 0506  NA 135 137  K 3.7 4.1  CL 104 108  CO2 22 22  GLUCOSE 102* 101*  BUN 23 26*  CREATININE 1.15 1.39*  CALCIUM 8.9 8.4*   GFR: Estimated Creatinine Clearance: 34.3 mL/min (A) (by C-G formula based on SCr of 1.39 mg/dL (H)). Recent Labs  Lab 05/25/18 0933 05/26/18 0506 05/26/18 1100  PROCALCITON  --   --  0.20  WBC 11.2* 8.4  --     Liver Function Tests: Recent Labs  Lab 05/25/18 0933 05/26/18 0506  AST 48* 69*  ALT 36 44  ALKPHOS 66 58  BILITOT 1.0 0.8  PROT 7.2 5.6*  ALBUMIN 3.2* 2.5*   No results for input(s): LIPASE, AMYLASE in the last 168 hours. No results for input(s): AMMONIA in the last 168 hours.  ABG    Component Value Date/Time   PHART 7.330 (L) 06/03/2008 2135   PCO2ART 40.1 06/03/2008 2135   PO2ART 92.0 06/03/2008 2135   HCO3 21.2 06/03/2008 2135   TCO2 23 06/04/2008 1714   ACIDBASEDEF 4.0 (H) 06/03/2008 2135   O2SAT 97.0 06/03/2008 2135     Coagulation Profile: No results for input(s): INR, PROTIME in the last 168 hours.  Cardiac Enzymes: No results for input(s): CKTOTAL, CKMB, CKMBINDEX, TROPONINI in the last 168 hours.  HbA1C: Hgb A1c MFr Bld  Date/Time Value Ref Range Status  05/29/2008 12:49 PM  4.6 - 6.1 % Final   5.8 (NOTE)   The ADA recommends the following therapeutic goal for glycemic   control related to Hgb A1C measurement:   Goal of Therapy:   < 7.0% Hgb A1C   Reference: American Diabetes Association: Clinical Practice   Recommendations 2008, Diabetes Care,  2008, 31:(Suppl 1).    CBG: No results for input(s): GLUCAP in the last 168 hours.  Review of Systems:   Review of Systems  Constitutional: Negative for chills and fever.  HENT: Negative.   Eyes: Negative.   Respiratory: Positive for cough and shortness of breath.   Gastrointestinal: Negative.   Genitourinary: Negative.   Skin: Negative.      Past Medical History  He,  has a past medical history of Acute  renal insufficiency, Anemia, Aortic insufficiency, Aortic stenosis, Atrial fibrillation ([place]), Coronary artery disease, Hyperlipidemia, Hypertension, Nephrolithiasis, Peptic ulcer, Pulmonary nodules, and Right coronary artery occlusion ([place]).   Surgical History    Past Surgical History:  Procedure Laterality Date  . AORTIC VALVE REPLACEMENT  06/04/2008   Aortic valve replacement with a 25 mm model [place] pericardial tissue valve, serial number [id number] -- Coronary artery bypass grafting x1 with endovein  . BILROTH II PROCEDURE     anastomosis -- Peptic ulcer surgery  . CARDIAC CATHETERIZATION  05/19/2008   Two-vessel obstructive CAD involving small diagonal branch.  The RCA is occluded and has left-to-right collaterals --  Normal LV function -- Normal pulmonary artery pressures -- Moderate aortic stenosis -- Moderate aortic root enlargement with mild aortic insufficiency     . CORONARY STENT PLACEMENT  2000   stent placement in the circumflex coronary artery  . FINGER AMPUTATION     post partial amputation  of right index finger     Social History   reports that he quit smoking about 45 years ago. His smoking use included cigarettes. He has a 14.00 pack-year smoking history. He has never used smokeless tobacco. He reports that he does not drink alcohol or use drugs.   Family History   His family history includes Coronary artery disease in his brother; Heart attack in his mother.   Allergies No Known Allergies

## 2018-05-27 DIAGNOSIS — R918 Other nonspecific abnormal finding of lung field: Secondary | ICD-10-CM

## 2018-05-27 LAB — CBC
HCT: 36 % — ABNORMAL LOW (ref 39.0–52.0)
Hemoglobin: 11.5 g/dL — ABNORMAL LOW (ref 13.0–17.0)
MCH: 28.3 pg (ref 26.0–34.0)
MCHC: 31.9 g/dL (ref 30.0–36.0)
MCV: 88.7 fL (ref 80.0–100.0)
Platelets: 255 10*3/uL (ref 150–400)
RBC: 4.06 MIL/uL — ABNORMAL LOW (ref 4.22–5.81)
RDW: 13.1 % (ref 11.5–15.5)
WBC: 8.6 10*3/uL (ref 4.0–10.5)
nRBC: 0 % (ref 0.0–0.2)

## 2018-05-27 LAB — PROCALCITONIN: Procalcitonin: 0.27 ng/mL

## 2018-05-27 LAB — BASIC METABOLIC PANEL
Anion gap: 8 (ref 5–15)
BUN: 26 mg/dL — ABNORMAL HIGH (ref 8–23)
CO2: 21 mmol/L — ABNORMAL LOW (ref 22–32)
Calcium: 8.4 mg/dL — ABNORMAL LOW (ref 8.9–10.3)
Chloride: 107 mmol/L (ref 98–111)
Creatinine, Ser: 1.54 mg/dL — ABNORMAL HIGH (ref 0.61–1.24)
GFR calc Af Amer: 47 mL/min — ABNORMAL LOW (ref >60)
GFR calc non Af Amer: 40 mL/min — ABNORMAL LOW (ref >60)
Glucose, Bld: 88 mg/dL (ref 70–99)
Potassium: 4.5 mmol/L (ref 3.5–5.1)
Sodium: 136 mmol/L (ref 135–145)

## 2018-05-27 MED ORDER — IPRATROPIUM-ALBUTEROL 0.5-2.5 (3) MG/3ML IN SOLN: 3.0000 mL | RESPIRATORY_TRACT | Status: DC | PRN

## 2018-05-27 MED ORDER — IBUPROFEN 200 MG PO TABS
600.0000 mg | ORAL_TABLET | Freq: Four times a day (QID) | ORAL | Status: DC | PRN
  Administered 2018-05-27: 600 mg via ORAL
  Filled 2018-05-27: qty 3

## 2018-05-27 MED ORDER — BENZONATATE 100 MG PO CAPS
100.0000 mg | ORAL_CAPSULE | Freq: Three times a day (TID) | ORAL | Status: DC | PRN
  Administered 2018-05-27 (×2): 100 mg via ORAL
  Filled 2018-05-27 (×2): qty 1

## 2018-05-27 NOTE — Consult Note (Signed)
NAME:  Noah Martin, MRN:  [id number], DOB:  July 31, 1929, LOS: 1 ADMISSION DATE:  05/25/2018, CONSULTATION DATE: 05/25/2018 REFERRING MD: Dr. Gloris Ham, CHIEF COMPLAINT: Shortness of breath, abnormal CT scan of the chest  Brief History   Patient was admitted to the hospital with worsening shortness of breath with a protracted cough  History of present illness   Cough which is had for many years, taking off ACE inhibitors but cough persisted Symptoms have been ongoing for few weeks Was treated in the outpatient setting for possible pneumonia, nonresolution of symptoms led to presentation to the hospital Is not feeling acutely ill at present  He has lost some weight about 15 pounds in the last 12 months Denies hemoptysis  Past Medical History   Past Medical History:  Diagnosis Date  . Acute renal insufficiency    postoperatively  . Anemia   . Aortic insufficiency    mild, -- with a markedly dilated aortic root  . Aortic stenosis    status post aortic valve replacement  . Atrial fibrillation ([place])    Intraoperative and postoperative atrial fibrillation  . Coronary artery disease    status post CABG x1 -- with endo vein to the RCA  . Hyperlipidemia   . Hypertension   . Nephrolithiasis   . Peptic ulcer    status post gastrectomy  . Pulmonary nodules    chronic -- 2008-2009 without change  . Right coronary artery occlusion (HCC)    Chronic occlusion of right coronary artery   Significant Hospital Events   Unremarkable Productive cough-yellow phlegm, no fevers  Consults:  PCCM  Procedures:  None  Significant Diagnostic Tests:  CT scan of the chest-extensive infiltrative process, mediastinal adenopathy, right lung masslike lesion, endobronchial narrowing  Micro Data:  None   Antimicrobials:  Zosyn 1/25>> Vancomycin 1/25>>  Interim history/subjective:  Feels comfortable, on RA  Objective   Blood pressure 125/66, pulse 61, temperature (!) 97.5 F (36.4 C),  temperature source Oral, resp. rate 16, height 5\' 9"  (1.753 m), weight 63.5 kg, SpO2 94 %.        Intake/Output Summary (Last 24 hours) at 05/27/2018 1707 Last data filed at 05/27/2018 1336 Gross per 24 hour  Intake 1119.87 ml  Output -  Net 1119.87 ml   Filed Weights   05/25/18 0641 05/25/18 1348  Weight: 61.2 kg 63.5 kg    Examination: General: elderly man, awake and appropriate HENT: OP clear Lungs: decreased R base, otherwise clear Cardiovascular: RRR no M Abdomen: soft, NT, + BS Extremities: no edema Neuro: non-focal  Resolved Hospital Problem list     Assessment & Plan:  Abnormal CT scan of the chest, RLL mass Plan for FOB 1/28 at 11:00.  He will probably be able to go home 1/28 after the procedure on Po abx.   Failed outpatient treatment for pneumonia Likely convert to Po abx on 1/28   Best practice:  Diet: Regular Pain/Anxiety/Delirium protocol (if indicated): Not applicable VAP protocol (if indicated): Not applicable DVT prophylaxis: Enoxaparin GI prophylaxis: Protonix Glucose control: Not applicable Mobility: As tolerated Code Status: Full code Family Communication: Family member at bedside Disposition: discussed with pt and family on 1/28  Labs   CBC: Recent Labs  Lab 05/25/18 0933 05/26/18 0506 05/27/18 0507  WBC 11.2* 8.4 8.6  NEUTROABS 8.8*  --   --   HGB 12.6* 10.2* 11.5*  HCT 39.3 32.2* 36.0*  MCV 87.5 88.7 88.7  PLT 294 232 469    Basic Metabolic  Panel: Recent Labs  Lab 05/25/18 0933 05/26/18 0506 05/27/18 0507  NA 135 137 136  K 3.7 4.1 4.5  CL 104 108 107  CO2 22 22 21*  GLUCOSE 102* 101* 88  BUN 23 26* 26*  CREATININE 1.15 1.39* 1.54*  CALCIUM 8.9 8.4* 8.4*   GFR: Estimated Creatinine Clearance: 30.9 mL/min (A) (by C-G formula based on SCr of 1.54 mg/dL (H)). Recent Labs  Lab 05/25/18 0933 05/26/18 0506 05/26/18 1100 05/27/18 0507  PROCALCITON  --   --  0.20 0.27  WBC 11.2* 8.4  --  8.6    Liver Function  Tests: Recent Labs  Lab 05/25/18 0933 05/26/18 0506  AST 48* 69*  ALT 36 44  ALKPHOS 66 58  BILITOT 1.0 0.8  PROT 7.2 5.6*  ALBUMIN 3.2* 2.5*   No results for input(s): LIPASE, AMYLASE in the last 168 hours. No results for input(s): AMMONIA in the last 168 hours.  ABG    Component Value Date/Time   PHART 7.330 (L) 06/03/2008 2135   PCO2ART 40.1 06/03/2008 2135   PO2ART 92.0 06/03/2008 2135   HCO3 21.2 06/03/2008 2135   TCO2 23 06/04/2008 1714   ACIDBASEDEF 4.0 (H) 06/03/2008 2135   O2SAT 97.0 06/03/2008 2135     Coagulation Profile: No results for input(s): INR, PROTIME in the last 168 hours.  Cardiac Enzymes: No results for input(s): CKTOTAL, CKMB, CKMBINDEX, TROPONINI in the last 168 hours.  HbA1C: Hgb A1c MFr Bld  Date/Time Value Ref Range Status  05/29/2008 12:49 PM  4.6 - 6.1 % Final   5.8 (NOTE)   The ADA recommends the following therapeutic goal for glycemic   control related to Hgb A1C measurement:   Goal of Therapy:   < 7.0% Hgb A1C   Reference: American Diabetes Association: Clinical Practice   Recommendations 2008, Diabetes Care,  2008, 31:(Suppl 1).    Baltazar Apo, MD, PhD 05/27/2018, 5:10 PM [facility] [phone] or if no answer [phone]

## 2018-05-27 NOTE — H&P (View-Only) (Signed)
NAME:  Noah Martin, MRN:  [id number], DOB:  25-Nov-1929, LOS: 1 ADMISSION DATE:  05/25/2018, CONSULTATION DATE: 05/25/2018 REFERRING MD: Dr. Gloris Ham, CHIEF COMPLAINT: Shortness of breath, abnormal CT scan of the chest  Brief History   Patient was admitted to the hospital with worsening shortness of breath with a protracted cough  History of present illness   Cough which is had for many years, taking off ACE inhibitors but cough persisted Symptoms have been ongoing for few weeks Was treated in the outpatient setting for possible pneumonia, nonresolution of symptoms led to presentation to the hospital Is not feeling acutely ill at present  He has lost some weight about 15 pounds in the last 12 months Denies hemoptysis  Past Medical History   Past Medical History:  Diagnosis Date  . Acute renal insufficiency    postoperatively  . Anemia   . Aortic insufficiency    mild, -- with a markedly dilated aortic root  . Aortic stenosis    status post aortic valve replacement  . Atrial fibrillation ([place])    Intraoperative and postoperative atrial fibrillation  . Coronary artery disease    status post CABG x1 -- with endo vein to the RCA  . Hyperlipidemia   . Hypertension   . Nephrolithiasis   . Peptic ulcer    status post gastrectomy  . Pulmonary nodules    chronic -- 2008-2009 without change  . Right coronary artery occlusion (HCC)    Chronic occlusion of right coronary artery   Significant Hospital Events   Unremarkable Productive cough-yellow phlegm, no fevers  Consults:  PCCM  Procedures:  None  Significant Diagnostic Tests:  CT scan of the chest-extensive infiltrative process, mediastinal adenopathy, right lung masslike lesion, endobronchial narrowing  Micro Data:  None   Antimicrobials:  Zosyn 1/25>> Vancomycin 1/25>>  Interim history/subjective:  Feels comfortable, on RA  Objective   Blood pressure 125/66, pulse 61, temperature (!) 97.5 F (36.4 C),  temperature source Oral, resp. rate 16, height 5\' 9"  (1.753 m), weight 63.5 kg, SpO2 94 %.        Intake/Output Summary (Last 24 hours) at 05/27/2018 1707 Last data filed at 05/27/2018 1336 Gross per 24 hour  Intake 1119.87 ml  Output -  Net 1119.87 ml   Filed Weights   05/25/18 0641 05/25/18 1348  Weight: 61.2 kg 63.5 kg    Examination: General: elderly man, awake and appropriate HENT: OP clear Lungs: decreased R base, otherwise clear Cardiovascular: RRR no M Abdomen: soft, NT, + BS Extremities: no edema Neuro: non-focal  Resolved Hospital Problem list     Assessment & Plan:  Abnormal CT scan of the chest, RLL mass Plan for FOB 1/28 at 11:00.  He will probably be able to go home 1/28 after the procedure on Po abx.   Failed outpatient treatment for pneumonia Likely convert to Po abx on 1/28   Best practice:  Diet: Regular Pain/Anxiety/Delirium protocol (if indicated): Not applicable VAP protocol (if indicated): Not applicable DVT prophylaxis: Enoxaparin GI prophylaxis: Protonix Glucose control: Not applicable Mobility: As tolerated Code Status: Full code Family Communication: Family member at bedside Disposition: discussed with pt and family on 1/28  Labs   CBC: Recent Labs  Lab 05/25/18 0933 05/26/18 0506 05/27/18 0507  WBC 11.2* 8.4 8.6  NEUTROABS 8.8*  --   --   HGB 12.6* 10.2* 11.5*  HCT 39.3 32.2* 36.0*  MCV 87.5 88.7 88.7  PLT 294 232 662    Basic Metabolic  Panel: Recent Labs  Lab 05/25/18 0933 05/26/18 0506 05/27/18 0507  NA 135 137 136  K 3.7 4.1 4.5  CL 104 108 107  CO2 22 22 21*  GLUCOSE 102* 101* 88  BUN 23 26* 26*  CREATININE 1.15 1.39* 1.54*  CALCIUM 8.9 8.4* 8.4*   GFR: Estimated Creatinine Clearance: 30.9 mL/min (A) (by C-G formula based on SCr of 1.54 mg/dL (H)). Recent Labs  Lab 05/25/18 0933 05/26/18 0506 05/26/18 1100 05/27/18 0507  PROCALCITON  --   --  0.20 0.27  WBC 11.2* 8.4  --  8.6    Liver Function  Tests: Recent Labs  Lab 05/25/18 0933 05/26/18 0506  AST 48* 69*  ALT 36 44  ALKPHOS 66 58  BILITOT 1.0 0.8  PROT 7.2 5.6*  ALBUMIN 3.2* 2.5*   No results for input(s): LIPASE, AMYLASE in the last 168 hours. No results for input(s): AMMONIA in the last 168 hours.  ABG    Component Value Date/Time   PHART 7.330 (L) 06/03/2008 2135   PCO2ART 40.1 06/03/2008 2135   PO2ART 92.0 06/03/2008 2135   HCO3 21.2 06/03/2008 2135   TCO2 23 06/04/2008 1714   ACIDBASEDEF 4.0 (H) 06/03/2008 2135   O2SAT 97.0 06/03/2008 2135     Coagulation Profile: No results for input(s): INR, PROTIME in the last 168 hours.  Cardiac Enzymes: No results for input(s): CKTOTAL, CKMB, CKMBINDEX, TROPONINI in the last 168 hours.  HbA1C: Hgb A1c MFr Bld  Date/Time Value Ref Range Status  05/29/2008 12:49 PM  4.6 - 6.1 % Final   5.8 (NOTE)   The ADA recommends the following therapeutic goal for glycemic   control related to Hgb A1C measurement:   Goal of Therapy:   < 7.0% Hgb A1C   Reference: American Diabetes Association: Clinical Practice   Recommendations 2008, Diabetes Care,  2008, 31:(Suppl 1).    Baltazar Apo, MD, PhD 05/27/2018, 5:10 PM  [facility] [phone] or if no answer [phone]

## 2018-05-27 NOTE — Progress Notes (Signed)
Initial Nutrition Assessment  DOCUMENTATION CODES:   Non-severe (moderate) malnutrition in context of acute illness/injury  INTERVENTION:   Encouraged PO intakes  NUTRITION DIAGNOSIS:   Moderate Malnutrition related to acute illness(pneumonia) as evidenced by energy intake < or equal to 75% for > or equal to 1 month, moderate fat depletion, moderate muscle depletion.  GOAL:   Patient will meet greater than or equal to 90% of their needs  MONITOR:   PO intake, Labs, Weight trends, I & O's  REASON FOR ASSESSMENT:   Malnutrition Screening Tool    ASSESSMENT:   85 year old with past medical history relevant for coronary artery disease/aortic stenosis status post one-vessel bypass and bioprosthetic AV valve replacement in 2010, hypertension, hyperlipidemia, BPH who presented to the ED with persistent weakness, cough, apparent failure of outpatient treatment of pneumonia and was found to have likely metastatic lung cancer as well as postobstructive pneumonia.  Patient in room with wife and daughter at bedside. Pt states he ate a good breakfast and everything tasted great. PTA pt was having some loss of taste and was eating very poorly per his daughter. Pt's daughter provides meals for her parents. Pt states he does not have an appetite. Pt not interested in protein supplements, states he has peculiar tastes and can't stomach certain foods. Encouraged small, frequent meals and to prioritize protein with meals.   Per patient UBW is 150 lb. Pt has lost 10-15 lb over the past year which is insignificant for time frame.   Medications reviewed.  Labs reviewed: GFR: 40  NUTRITION - FOCUSED PHYSICAL EXAM:    Most Recent Value  Orbital Region  Mild depletion  Upper Arm Region  Moderate depletion  Thoracic and Lumbar Region  Unable to assess  Buccal Region  Moderate depletion  Temple Region  Moderate depletion  Clavicle Bone Region  Moderate depletion  Clavicle and Acromion Bone  Region  Moderate depletion  Scapular Bone Region  Moderate depletion  Dorsal Hand  Moderate depletion  Patellar Region  Unable to assess  Anterior Thigh Region  Unable to assess  Posterior Calf Region  Unable to assess  Edema (RD Assessment)  None       Diet Order:   Diet Order            Diet NPO time specified Except for: [employer], Sips with Meds  Diet effective midnight        Diet regular Room service appropriate? Yes; Fluid consistency: Thin  Diet effective now              EDUCATION NEEDS:   Education needs have been addressed  Skin:  Skin Assessment: Reviewed RN Assessment  Last BM:  1/26  Height:   Ht Readings from Last 1 Encounters:  05/25/18 5\' 9"  (1.753 m)    Weight:   Wt Readings from Last 1 Encounters:  05/25/18 63.5 kg    Ideal Body Weight:  71.7 kg  BMI:  Body mass index is 20.67 kg/m.  Estimated Nutritional Needs:   Kcal:  1700-1900  Protein:  80-90g  Fluid:  1.7L/day  Noah Bibles, MS, RD, LDN [facility] Pager: [phone] After Hours Pager: [phone]

## 2018-05-27 NOTE — Progress Notes (Signed)
PROGRESS NOTE    JAMORI BIGGAR  GGE:[id number] DOB: [birth year] DOA: 05/25/2018 PCP: London Pepper, MD   Brief Narrative:  85 year old with past medical history relevant for coronary artery disease/aortic stenosis status post one-vessel bypass and bioprosthetic AV valve replacement in 2010, hypertension, hyperlipidemia, BPH who presented to the ED with persistent weakness, cough, apparent failure of outpatient treatment of pneumonia and was found to have likely metastatic lung cancer as well as postobstructive pneumonia.   Assessment & Plan:   Active Problems:   Postobstructive pneumonia   #) Postobstructive pneumonia: CT scan shows evidence of consolidation concerning for either tumor or pneumonia. 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 -Pulmonology consulted, will plan for bronchoscopy on 05/28/2018, n.p.o. at midnight -Outpatient oncology follow-up  #) Hypertension/hyperlipidemia: -Continue simvastatin 40 mg nightly -Continue Zetia - Continue losartan 50 mg daily  #) BPH: -Continue tamsulosin 0.4 mg daily  #) Status post bioprosthetic AVR/one-vessel CABG: -Continue aspirin 81 mg -Continue ARB -Continue statin  Fluids: Tolerating p.o. Electrolytes: Monitor and supplement Nutrition: Heart healthy diet  Prophylaxis: Enoxaparin  Disposition: Pending bronchoscopy with biopsy  DO NOT RESUSCITATE    Consultants:   Pulmonology  Procedures:   None  Antimicrobials:  IV Zosyn started 05/25/2018   Subjective: Patient reports he is doing well this morning.  His primary complaint is cough that  is nonproductive.  He denies any chest pain, fevers, congestion, abdominal pain, nausea, vomiting, diarrhea.  Objective: Vitals:   05/26/18 1306 05/26/18 2125 05/26/18 2215 05/27/18 0518  BP: (!) 115/59 128/72  121/65  Pulse: 62 76  66  Resp: 16 16  16   Temp: 97.8 F (36.6 C) 98.2 F (36.8 C)  98.1 F (36.7 C)  TempSrc: Oral Oral  Oral  SpO2: 93% 90% 91% 93%  Weight:      Height:        Intake/Output Summary (Last 24 hours) at 05/27/2018 1049 Last data filed at 05/27/2018 4650 Gross per 24 hour  Intake 1373.41 ml  Output -  Net 1373.41 ml   Filed Weights   05/25/18 0641 05/25/18 1348  Weight: 61.2 kg 63.5 kg    Examination:  General exam: Appears calm and comfortable  Respiratory system: No increased work of breathing, diminished lung sounds on right with scattered crackles, no wheezes or rhonchi Cardiovascular system: Regular rate and rhythm, no murmurs Gastrointestinal system: Soft, nondistended, no rebound or guarding, plus bowel sounds Central nervous system: Alert and oriented.  Grossly intact, moving all extremities. Extremities: No lower extremity edema Skin: Well-healed midline incision over chest and abdomen Psychiatry: Judgement and insight appear normal. Mood & affect appropriate.     Data Reviewed: I have personally reviewed following labs and imaging studies  CBC: Recent Labs  Lab 05/25/18 0933 05/26/18 0506 05/27/18 0507  WBC 11.2* 8.4 8.6  NEUTROABS 8.8*  --   --   HGB 12.6* 10.2* 11.5*  HCT 39.3 32.2* 36.0*  MCV 87.5 88.7 88.7  PLT 294 232 354   Basic Metabolic Panel: Recent Labs  Lab 05/25/18 0933 05/26/18 0506 05/27/18 0507  NA 135 137 136  K 3.7 4.1 4.5  CL 104 108 107  CO2 22 22  21*  GLUCOSE 102* 101* 88  BUN 23 26* 26*  CREATININE 1.15 1.39* 1.54*  CALCIUM 8.9 8.4* 8.4*   GFR: Estimated Creatinine Clearance: 30.9 mL/min (A) (by C-G formula based on SCr of 1.54 mg/dL (H)). Liver Function Tests: Recent Labs  Lab  05/25/18 0933 05/26/18 0506  AST 48* 69*  ALT 36 44  ALKPHOS 66 58  BILITOT 1.0 0.8  PROT 7.2 5.6*  ALBUMIN 3.2* 2.5*   No results for input(s): LIPASE, AMYLASE in the last 168 hours. No results for input(s): AMMONIA in the last 168 hours. Coagulation Profile: No results for input(s): INR, PROTIME in the last 168 hours. Cardiac Enzymes: No results for input(s): CKTOTAL, CKMB, CKMBINDEX, TROPONINI in the last 168 hours. BNP (last 3 results) No results for input(s): PROBNP in the last 8760 hours. HbA1C: No results for input(s): HGBA1C in the last 72 hours. CBG: No results for input(s): GLUCAP in the last 168 hours. Lipid Profile: No results for input(s): CHOL, HDL, LDLCALC, TRIG, CHOLHDL, LDLDIRECT in the last 72 hours. Thyroid Function Tests: No results for input(s): TSH, T4TOTAL, FREET4, T3FREE, THYROIDAB in the last 72 hours. Anemia Panel: No results for input(s): VITAMINB12, FOLATE, FERRITIN, TIBC, IRON, RETICCTPCT in the last 72 hours. Sepsis Labs: Recent Labs  Lab 05/26/18 1100 05/27/18 0507  PROCALCITON 0.20 0.27    No results found for this or any previous visit (from the past 240 hour(s)).       Radiology Studies: Ct Chest W Contrast  Result Date: 05/25/2018 CLINICAL DATA:  85 year old male with a history of pneumonia, without fever or pain and no approve min after antibiotics EXAM: CT CHEST WITH CONTRAST TECHNIQUE: Multidetector CT imaging of the chest was performed during intravenous contrast administration. CONTRAST:  57mL OMNIPAQUE IOHEXOL 300 MG/ML  SOLN COMPARISON:  11/11/2007, 07/20/2006, 01/15/2006 FINDINGS: Cardiovascular: Heart size within normal limits. No pericardial fluid/thickening. Dense calcifications of the left main, left anterior descending, circumflex, right coronary arteries. Surgical changes of aortic valve repair and CABG. Median sternotomy. Calcifications of the aortic arch. Branch vessels are patent. Cervical cerebral vessels patent at the  base of the neck. No dissection or aneurysm of descending thoracic aorta. Unremarkable diameter of the main pulmonary artery. Evaluation for filling defects limited by the contrast bolus timing. Mediastinum/Nodes: Multiple borderline enlarged lymph nodes throughout the mediastinum in all nodes stations. The index node in the lowest paratracheal station measures 16 mm. Subcarinal node measures 12 mm. Right hilar lymph nodes present. AP window nodes present. Lungs/Pleura: Confluent opacity involving the right middle lobe, with minimal aeration maintained of the right middle lobe. Confluent airspace opacity in the superior aspect of the right lower lobe with multiple bronchial airway occlusions including the right middle lobe, and the segments of the right lower lobe to the superior and medial segments. There is a more heterogeneously attenuating/enhancing region associated with the stumped bronchus to the superior segment of the right lower lobe (image 94 of series 2. Significant interlobular septal thickening involving right middle lobe, right lower lobe, and the adjacent segments of the right upper lobe. Additionally there are small nodules along the proximal bronchi of the right upper lobe. These are most apparent on image 66, 70, 68 of series 5. Additional nodule of the right upper lobe image 82 of series 5. Upper Abdomen: New ill-defined heterogeneously attenuating nodule of segment 2 of the liver measuring 2.7 cm as well as a ill-defined heterogeneously attenuating lesion of segment 3 measuring 3.8 cm by 4.1 cm. The  right aspect of the liver demonstrates decreased perfusion, which may be flow related. Nodularity of the right adrenal gland, new from comparison chest CTs. Surgical changes at the epigastric region. Musculoskeletal: No acute displaced fracture. No aggressive lytic lesions are identified. IMPRESSION: CT findings concerning for a superior segment right lower lobe adenocarcinoma with associated  lymphangitic carcinomatosis and metastases to the right upper lobe, right middle lobe, mediastinal lymph nodes, liver, and right adrenal gland. Referral for pulmonary evaluation and bronchoscopy recommended, as well as oncology referral. Confluent airspace disease of right middle lobe and right lower lobe, while most concerning for tumor involvement, could also represent a superimposed pneumonia/aspiration pneumonia. Bronchoscopy may be useful for differentiating infection/tumor. Trace right-sided pleural effusion. Surgical changes of prior median sternotomy, aortic valve replacement, and CABG. Electronically Signed   By: Corrie Mckusick D.O.   On: 05/25/2018 11:18        Scheduled Meds: . aspirin EC  81 mg Oral Daily  . ezetimibe  10 mg Oral Daily  . ipratropium-albuterol  3 mL Nebulization BID  . lidocaine  1 application Topical Once  . losartan  50 mg Oral Daily  . pantoprazole  20 mg Oral Daily  . simvastatin  40 mg Oral q1800  . tamsulosin  0.4 mg Oral Daily   Continuous Infusions: . sodium chloride Stopped (05/26/18 2100)  . sodium chloride    . piperacillin-tazobactam (ZOSYN)  IV 3.375 g (05/27/18 1005)     LOS: 1 day    Time spent: 58    Cristy Folks, MD [facility]  If 7PM-7AM, please contact night-coverage [email or web address] Password [email or web address] 05/27/2018, 10:49 AM

## 2018-05-28 ENCOUNTER — Encounter (HOSPITAL_COMMUNITY)
Admission: EM | Disposition: A | Discharge status: Home / Self Care | Payer: Self-pay | Source: Home / Self Care | Attending: Internal Medicine

## 2018-05-28 ENCOUNTER — Inpatient Hospital Stay (HOSPITAL_COMMUNITY): Payer: Medicare Other

## 2018-05-28 ENCOUNTER — Telehealth: Payer: Self-pay | Admitting: Emergency Medicine

## 2018-05-28 DIAGNOSIS — R918 Other nonspecific abnormal finding of lung field: Secondary | ICD-10-CM | POA: Diagnosis present

## 2018-05-28 DIAGNOSIS — E440 Moderate protein-calorie malnutrition: Secondary | ICD-10-CM

## 2018-05-28 HISTORY — PX: VIDEO BRONCHOSCOPY: SHX5072

## 2018-05-28 LAB — BASIC METABOLIC PANEL
Anion gap: 11 (ref 5–15)
BUN: 23 mg/dL (ref 8–23)
CO2: 20 mmol/L — ABNORMAL LOW (ref 22–32)
Calcium: 8.4 mg/dL — ABNORMAL LOW (ref 8.9–10.3)
Chloride: 105 mmol/L (ref 98–111)
Creatinine, Ser: 1.46 mg/dL — ABNORMAL HIGH (ref 0.61–1.24)
GFR calc Af Amer: 50 mL/min — ABNORMAL LOW (ref >60)
GFR calc non Af Amer: 43 mL/min — ABNORMAL LOW (ref >60)
Glucose, Bld: 108 mg/dL — ABNORMAL HIGH (ref 70–99)
Potassium: 3.7 mmol/L (ref 3.5–5.1)
Sodium: 136 mmol/L (ref 135–145)

## 2018-05-28 LAB — CBC
HCT: 37.2 % — ABNORMAL LOW (ref 39.0–52.0)
Hemoglobin: 11.9 g/dL — ABNORMAL LOW (ref 13.0–17.0)
MCH: 28.1 pg (ref 26.0–34.0)
MCHC: 32 g/dL (ref 30.0–36.0)
MCV: 87.7 fL (ref 80.0–100.0)
Platelets: 286 10*3/uL (ref 150–400)
RBC: 4.24 MIL/uL (ref 4.22–5.81)
RDW: 13.2 % (ref 11.5–15.5)
WBC: 9.9 10*3/uL (ref 4.0–10.5)
nRBC: 0 % (ref 0.0–0.2)

## 2018-05-28 LAB — PROCALCITONIN: Procalcitonin: 0.46 ng/mL

## 2018-05-28 SURGERY — VIDEO BRONCHOSCOPY WITHOUT FLUORO
Anesthesia: Moderate Sedation | Laterality: Bilateral

## 2018-05-28 MED ORDER — MIDAZOLAM HCL (PF) 10 MG/2ML IJ SOLN
INTRAMUSCULAR | Status: DC | PRN
  Administered 2018-05-28: 1 mg via INTRAVENOUS
  Administered 2018-05-28: 2 mg via INTRAVENOUS

## 2018-05-28 MED ORDER — FENTANYL CITRATE (PF) 100 MCG/2ML IJ SOLN
INTRAMUSCULAR | Status: AC
  Filled 2018-05-28: qty 4

## 2018-05-28 MED ORDER — LIDOCAINE HCL 1 % IJ SOLN
INTRAMUSCULAR | Status: DC | PRN
  Administered 2018-05-28: 6 mL via RESPIRATORY_TRACT

## 2018-05-28 MED ORDER — AMOXICILLIN-POT CLAVULANATE 875-125 MG PO TABS: 1.0000 | ORAL_TABLET | Freq: Two times a day (BID) | ORAL | 0 refills | Status: AC

## 2018-05-28 MED ORDER — FENTANYL CITRATE (PF) 100 MCG/2ML IJ SOLN
INTRAMUSCULAR | Status: DC | PRN
  Administered 2018-05-28: 50 ug via INTRAVENOUS

## 2018-05-28 MED ORDER — MIDAZOLAM HCL (PF) 5 MG/ML IJ SOLN
INTRAMUSCULAR | Status: AC
  Filled 2018-05-28: qty 2

## 2018-05-28 MED ORDER — PHENYLEPHRINE HCL 0.25 % NA SOLN
NASAL | Status: DC | PRN
  Administered 2018-05-28: 2 via NASAL

## 2018-05-28 MED ORDER — BENZONATATE 100 MG PO CAPS: 100.0000 mg | ORAL_CAPSULE | Freq: Three times a day (TID) | ORAL | 0 refills | Status: AC | PRN

## 2018-05-28 MED ORDER — SODIUM CHLORIDE 0.9 % IV SOLN
INTRAVENOUS | Status: DC
  Administered 2018-05-28: 11:00:00 via INTRAVENOUS

## 2018-05-28 MED ORDER — LIDOCAINE HCL URETHRAL/MUCOSAL 2 % EX GEL
CUTANEOUS | Status: DC | PRN
  Administered 2018-05-28: 1

## 2018-05-28 NOTE — Interval H&P Note (Signed)
PCCM Interval Note  Pt is NPO for FOB and possible RLL biopsies today. All questions answered. Risks and benefits discussed. No barriers identified. \  Will proceed as planned.   Baltazar Apo, MD, PhD 05/28/2018, 11:10 AM [facility] [phone] or if no answer [phone]

## 2018-05-28 NOTE — Progress Notes (Signed)
Called Dr. Herbert Moors to question if patient is to be d/c due to when receiving report from respiratory this writer was told that Dr. Lamonte Sakai stated that patient was not ready for discharge. Dr. Herbert Moors spoke with Dr. Lamonte Sakai and reports to nurse that Dr. Lamonte Sakai is in agreement with patient being discharged home and to proceed with discharge. Patient to be discharged with family via private vehicle.

## 2018-05-28 NOTE — Op Note (Signed)
[facility] Cardiopulmonary Patient Name: Noah Martin Procedure Date: 05/28/2018 MRN: [id number] Attending MD: Collene Gobble , MD Date of Birth: Apr 17, 1930 CSN: [id number] Age: 85 Admit Type: Inpatient Ethnicity: Not Hispanic or Latino Procedure:            Bronchoscopy Indications:          Right lower lobe mass Providers:            Collene Gobble, MD, Andre Lefort RRT,RCP, Ashley Mariner RRT,RCP Referring MD:          Medicines:            Midazolam 3 mg IV, Fentanyl 50 mcg IV Complications:        No immediate complications Estimated Blood Loss: Estimated blood loss was minimal. Procedure:      Pre-Anesthesia Assessment:      - A History and Physical has been performed. Patient meds and allergies       have been reviewed. The risks and benefits of the procedure and the       sedation options and risks were discussed with the patient. All       questions were answered and informed consent was obtained. Patient       identification and proposed procedure were verified prior to the       procedure by the physician in the procedure room. Mental Status       Examination: alert and oriented. Airway Examination: normal       oropharyngeal airway. Respiratory Examination: rhonchi. CV Examination:       normal. ASA Grade Assessment: II - A patient with mild systemic disease.       After reviewing the risks and benefits, the patient was deemed in       satisfactory condition to undergo the procedure. The anesthesia plan was       to use moderate sedation / analgesia (conscious sedation). Immediately       prior to administration of medications, the patient was re-assessed for       adequacy to receive sedatives. The heart rate, respiratory rate, oxygen       saturations, blood pressure, adequacy of pulmonary ventilation, and       response to care were monitored throughout the procedure. The physical       status of the patient was  re-assessed after the procedure.      After obtaining informed consent, the bronchoscope was passed under       direct vision. Throughout the procedure, the patient's blood pressure,       pulse, and oxygen saturations were monitored continuously. the BF-H190       ([id number]) Olympus Bronchoscope was introduced through the left nostril       and advanced to the tracheobronchial tree. The procedure was       accomplished without difficulty. The patient tolerated the procedure       well. The total duration of the procedure was 25 minutes. Findings:      The nasopharynx/oropharynx appears normal. The larynx appears normal.       The vocal cords appear normal. The subglottic space is normal. The       trachea is of normal caliber. The carina is sharp. The tracheobronchial       tree of the left lung was examined to  at least the first subsegmental       level. Bronchial mucosa and anatomy in the left lung are normal; there       are no endobronchial lesions, and no secretions.      Right Lung Abnormalities: Narrowing was found in the superior segment of       the right lower lobe (B6), in the medial basal segment of the right       lower lobe (B7), in the anterior basal segment of the right lower lobe       (B8), in the lateral basal segment of the right lower lobe (B9) and in       the posterior basal segment of the right lower lobe (B10). The airway is       moderately narrowed. The lesion was successfully traversed. Guided       brushings were obtained in the superior segment of the right lower lobe       with a cytology brush and sent for routine cytology. Three samples were       obtained. Endobronchial biopsies were performed in the superior segment       of the right lower lobe using forceps and sent for histopathology       examination. Four samples were obtained. BAL was performed in the RLL       superior segment (B6) of the lung and sent for cell count, bacterial       culture, viral  smears & culture, and fungal & AFB analysis and cytology.       30 mL of fluid were instilled. 12 mL were returned. The return was       blood-tinged. Impression:      - Right lower lobe mass      - The airway examination of the left lung was normal.      - Narrowing was found in the superior segment of the right lower lobe       (B6), in the medial basal segment of the right lower lobe (B7), in the       anterior basal segment of the right lower lobe (B8), in the lateral       basal segment of the right lower lobe (B9) and in the posterior basal       segment of the right lower lobe (B10).      - Brushings were obtained from the RLL superior segment.      - Endobronchial biopsies were performed RLL superior segment.      - Bronchoalveolar lavage was performed RLL superior segment. Moderate Sedation:      Moderate (conscious) sedation was personally administered by the       endoscopist. The following parameters were monitored: oxygen saturation,       heart rate, blood pressure, respiratory rate, EKG, adequacy of pulmonary       ventilation, and response to care. Total physician intraservice time was       25 minutes. Recommendation:      - Await BAL, biopsy and brushing results. Procedure Code(s):      --- Professional ---      [phone], Bronchoscopy, rigid or flexible, including fluoroscopic guidance,       when performed; with bronchial or endobronchial biopsy(s), single or       multiple sites      69678, Bronchoscopy, rigid or flexible, including fluoroscopic guidance,       when performed; with bronchial alveolar lavage  31623, Bronchoscopy, rigid or flexible, including fluoroscopic guidance,       when performed; with brushing or protected brushings      99152, Moderate sedation services provided by the same physician or       other qualified health care professional performing the diagnostic or       therapeutic service that the sedation supports, requiring the presence        of an independent trained observer to assist in the monitoring of the       patient's level of consciousness and physiological status; initial 15       minutes of intraservice time, patient age 54 years or older      [phone], Moderate sedation; each additional 15 minutes intraservice time Diagnosis Code(s):      --- Professional ---      R91.8, Other nonspecific abnormal finding of lung field      J98.4, Other disorders of lung CPT copyright 2018 American Medical Association. All rights reserved. The codes documented in this report are preliminary and upon coder review may  be revised to meet current compliance requirements. Collene Gobble, MD Collene Gobble, MD 05/28/2018 11:57:49 AM Number of Addenda: 0 Scope In: 11:22:06 AM Scope Out: 11:40:24 AM

## 2018-05-28 NOTE — Telephone Encounter (Signed)
Call pt with FOB results

## 2018-05-28 NOTE — Progress Notes (Signed)
Video Bronchoscopy done Intervention Bronchial washing Intervention Bronchial biopsy done Intervention Bronchial brushing done Procedure tolerated well

## 2018-05-28 NOTE — Discharge Instructions (Signed)
Flexible Bronchoscopy  Flexible bronchoscopy is a procedure that is used to examine the passageways in the lungs. During the procedure, a thin, flexible tool with a camera on it (bronchoscope) is passed into the mouth or nose, down through the windpipe (trachea), and into the air tubes (bronchi) in the lungs. This tool allows your health care provider to look at your lungs from the inside and take testing (diagnostic) samples if needed. Tell a health care provider about:  Any allergies you have.  All medicines you are taking, including vitamins, herbs, eye drops, creams, and over-the-counter medicines.  Any problems you or family members have had with anesthetic medicines.  Any blood disorders you have.  Any surgeries you have had.  Any medical conditions you have.  Whether you are pregnant or may be pregnant. What are the risks? Generally, this is a safe procedure. However, problems may occur, including:  Infection.  Bleeding.  Damage to other structures or organs.  Allergic reactions to medicines.  Collapsed lung (pneumothorax).  Increased need for oxygen or difficulty breathing after the procedure. What happens before the procedure? Medicines Ask your health care provider about:  Changing or stopping your regular medicines. This is especially important if you are taking diabetes medicines or blood thinners.  Taking medicines such as aspirin and ibuprofen. These medicines can thin your blood. Do not take these medicines before your procedure if your health care provider instructs you not to. You may be given antibiotic medicine to help prevent infection. Staying hydrated Follow instructions from your health care provider about hydration, which may include:  Up to 2 hours before the procedure - you may continue to drink clear liquids, such as water, clear fruit juice, black coffee, and plain tea. Eating and drinking Follow instructions from your health care provider  about eating and drinking, which may include:  8 hours before the procedure - stop eating heavy meals or foods such as meat, fried foods, or fatty foods.  6 hours before the procedure - stop eating light meals or foods, such as toast or cereal.  6 hours before the procedure - stop drinking milk or drinks that contain milk.  2 hours before the procedure - stop drinking clear liquids. General instructions  Plan to have someone take you home from the hospital or clinic.  If you will be going home right after the procedure, plan to have someone with you for 24 hours. What happens during the procedure?  To lower your risk of infection: ? Your health care team will wash or sanitize their hands. ? Your skin will be washed with soap.  An IV tube will be inserted into one of your veins.  You will be given a medicine (local anesthetic) to numb your mouth, nose, throat, and voice box (larynx). You may also be given one or more of the following: ? A medicine to help you relax (sedative). ? A medicine to control coughing. ? A medicine to dry up any fluids in your lungs (secretions).  A bronchoscope will be passed into your nose or mouth, and into your lungs. Your health care provider will examine your lungs.  Samples of airway secretions may be collected for testing.  If abnormal areas are seen in your airways, tissue samples may be removed for examination under a microscope (biopsy).  If tissue samples are needed from the outer parts of the lung, a type of X-ray (fluoroscopy) may be used to guide the bronchoscope to these areas.  If bleeding  occurs, you may be given medicine to stop or decrease the bleeding. The procedure may vary among health care providers and hospitals. What happens after the procedure?  Do not drive for 24 hours if you were given a sedative.  Your blood pressure, heart rate, breathing rate, and blood oxygen level will be monitored until the medicines you were given  have worn off.  You may have a chest X-ray to check for signs of pneumothorax.  You will not be allowed to eat or drink anything for 2 hours after your procedure.  If a biopsy was taken, it is up to you to get the results of your procedure. Ask your health care provider, or the department that is doing the procedure, when your results will be ready. Summary  Flexible bronchoscopy is a procedure that allows your health care provider to look closely at your lungs from the inside and take testing (diagnostic) samples if needed.  Risks of flexible bronchoscopy include bleeding, infection, and pneumothorax.  Before a flexible bronchoscopy, you will be given a medicine (local anesthetic) to numb your mouth, nose, throat, and voice box (larynx). Then, a bronchoscope will be passed into your nose or mouth, and into your lungs.  After the procedure, your blood pressure, heart rate, breathing rate, and blood oxygen level will be monitored until the medicines you were given have worn off. You may have a chest X-ray to check for signs of pneumothorax.  You will not be allowed to eat or drink anything for 2 hours after your procedure. This information is not intended to replace advice given to you by your health care provider. Make sure you discuss any questions you have with your health care provider. Document Released: 04/14/2000 Document Revised: 05/20/2016 Document Reviewed: 05/20/2016 Elsevier Interactive Patient Education  2019 [employer].

## 2018-05-28 NOTE — Discharge Summary (Signed)
Physician Discharge Summary  Noah Martin GHW:[id number] DOB: July 24, 1929 DOA: 05/25/2018  PCP: London Pepper, MD  Admit date: 05/25/2018 Discharge date: 05/28/2018  Admitted From: Home Disposition: Home  Recommendations for Outpatient Follow-up:  1. Follow up with PCP in 1-2 weeks 2. Please obtain BMP/CBC in one week 3. Please follow up on the results of your bronchoscopy with biopsy.  Home Health: No Equipment/Devices: None  Discharge Condition: Stable CODE STATUS: DNR Diet recommendation: Heart Healthy  Brief/Interim Summary:  #) Postobstructive pneumonia: Patient was admitted with postobstructive pneumonia.  Blood cultures on presentation did not have any growth.  Patient was continued on IV Zosyn and is charged home on oral Augmentin.  #) Widely metastatic likely lung cancer: CT scan showed widespread evidence of lung cancer including lymphangitic carcinomatosis, lymph nodes, metastases to the liver and adrenal gland.  Patient did receive a bronchoscopy on 05/28/2018 and biopsies from this are pending.  Patient will follow-up with oncology as an outpatient.  #) Hypertension/hyperlipidemia: Patient was continued on home simvastatin, Zetia, losartan.  #) BPH: Patient was continued on home tamsulosin.  #) Status post bioprosthetic AVR/one-vessel CABG: Patient was continued on aspirin, ARB, statin.  Discharge Diagnoses:  Active Problems:   Postobstructive pneumonia   Malnutrition of moderate degree    Discharge Instructions  Discharge Instructions    Call MD for:  difficulty breathing, headache or visual disturbances   Complete by:  As directed    Call MD for:  hives   Complete by:  As directed    Call MD for:  persistant dizziness or light-headedness   Complete by:  As directed    Call MD for:  persistant nausea and vomiting   Complete by:  As directed    Call MD for:  redness, tenderness, or signs of infection (pain, swelling, redness, odor or green/yellow  discharge around incision site)   Complete by:  As directed    Call MD for:  severe uncontrolled pain   Complete by:  As directed    Call MD for:  temperature >100.4   Complete by:  As directed    Diet - low sodium heart healthy   Complete by:  As directed    Discharge instructions   Complete by:  As directed    Please follow-up with your primary care doctor in 1 week.  Please follow-up with the cancer doctors as you will be called with an appointment.   Increase activity slowly   Complete by:  As directed      Allergies as of 05/28/2018   No Known Allergies     Medication List    TAKE these medications   acetaminophen 500 MG tablet Commonly known as:  TYLENOL Take 1,000 mg by mouth every 6 (six) hours as needed for mild pain or headache.   amoxicillin-clavulanate 875-125 MG tablet Commonly known as:  AUGMENTIN Take 1 tablet by mouth 2 (two) times daily for 7 days.   aspirin EC 81 MG tablet Take 1 tablet (81 mg total) by mouth daily.   azithromycin 250 MG tablet Commonly known as:  ZITHROMAX Take 250-500 mg by mouth See admin instructions. Take 500 mg by mouth on day 1 then take 250 mg by mouth on days 2-5   benzonatate 100 MG capsule Commonly known as:  TESSALON Take 1 capsule (100 mg total) by mouth every 8 (eight) hours as needed for cough.   ezetimibe 10 MG tablet Commonly known as:  ZETIA Take 1 tablet (10 mg total)  by mouth daily.   ezetimibe 10 MG tablet Commonly known as:  ZETIA TAKE 1 TABLET DAILY   lansoprazole 30 MG capsule Commonly known as:  PREVACID TAKE ONE CAPSULE BY MOUTH DAILY   losartan 50 MG tablet Commonly known as:  COZAAR Take 50 mg by mouth daily.   simvastatin 40 MG tablet Commonly known as:  ZOCOR TAKE ONE TABLET BY MOUTH DAILY AT 6 PM What changed:  See the new instructions.   tamsulosin 0.4 MG Caps capsule Commonly known as:  FLOMAX Take 0.4 mg by mouth daily.      Follow-up Information    London Pepper, MD.   Specialty:   Family Medicine Contact information: [place] [address] [phone]        London Pepper, MD.   Specialty:  Family Medicine Contact information: [address] [address] [phone]          No Known Allergies  Consultations:  Pulmonology   Procedures/Studies: Dg Chest 2 View  Result Date: 05/23/2018 CLINICAL DATA:  Re-evaluate pneumonia. EXAM: CHEST - 2 VIEW COMPARISON:  05/09/2018 FINDINGS: Previous median sternotomy CABG procedure. Progressive airspace consolidation involving the right lower lobe identified. There is a small right pleural effusion which appears increased from previous exam. Left lung remains clear. IMPRESSION: 1. Worsening aeration to the right lower lobe compatible with progression of pneumonia. Followup PA and lateral chest X-ray is recommended in 3-4 weeks following trial of antibiotic therapy to ensure resolution and exclude underlying malignancy. If this does not resolve further investigation with CT of the chest is advised to assess for underlying mass. Electronically Signed   By: Kerby Moors M.D.   On: 05/23/2018 15:54   Dg Chest 2 View  Result Date: 05/09/2018 CLINICAL DATA:  Dry cough EXAM: CHEST - 2 VIEW COMPARISON:  06/29/2008 FINDINGS: Consolidation in the right lower lobe compatible with pneumonia. Prior CABG and aortic valve repair. Heart is normal size. Left lung clear. No effusions or acute bony abnormality. IMPRESSION: Right lower lobe pneumonia. Followup PA and lateral chest X-ray is recommended in 3-4 weeks following trial of antibiotic therapy to ensure resolution and exclude underlying malignancy. Electronically Signed   By: Rolm Baptise M.D.   On: 05/09/2018 11:23   Ct Chest W Contrast  Result Date: 05/25/2018 CLINICAL DATA:  85 year old male with a history of pneumonia, without fever or pain and no approve min after antibiotics EXAM: CT CHEST WITH CONTRAST  TECHNIQUE: Multidetector CT imaging of the chest was performed during intravenous contrast administration. CONTRAST:  54mL OMNIPAQUE IOHEXOL 300 MG/ML  SOLN COMPARISON:  11/11/2007, 07/20/2006, 01/15/2006 FINDINGS: Cardiovascular: Heart size within normal limits. No pericardial fluid/thickening. Dense calcifications of the left main, left anterior descending, circumflex, right coronary arteries. Surgical changes of aortic valve repair and CABG. Median sternotomy. Calcifications of the aortic arch. Branch vessels are patent. Cervical cerebral vessels patent at the base of the neck. No dissection or aneurysm of descending thoracic aorta. Unremarkable diameter of the main pulmonary artery. Evaluation for filling defects limited by the contrast bolus timing. Mediastinum/Nodes: Multiple borderline enlarged lymph nodes throughout the mediastinum in all nodes stations. The index node in the lowest paratracheal station measures 16 mm. Subcarinal node measures 12 mm. Right hilar lymph nodes present. AP window nodes present. Lungs/Pleura: Confluent opacity involving the right middle lobe, with minimal aeration maintained of the right middle lobe. Confluent airspace opacity in the superior aspect of the right lower lobe  with multiple bronchial airway occlusions including the right middle lobe, and the segments of the right lower lobe to the superior and medial segments. There is a more heterogeneously attenuating/enhancing region associated with the stumped bronchus to the superior segment of the right lower lobe (image 94 of series 2. Significant interlobular septal thickening involving right middle lobe, right lower lobe, and the adjacent segments of the right upper lobe. Additionally there are small nodules along the proximal bronchi of the right upper lobe. These are most apparent on image 66, 70, 68 of series 5. Additional nodule of the right upper lobe image 82 of series 5. Upper Abdomen: New ill-defined heterogeneously  attenuating nodule of segment 2 of the liver measuring 2.7 cm as well as a ill-defined heterogeneously attenuating lesion of segment 3 measuring 3.8 cm by 4.1 cm. The right aspect of the liver demonstrates decreased perfusion, which may be flow related. Nodularity of the right adrenal gland, new from comparison chest CTs. Surgical changes at the epigastric region. Musculoskeletal: No acute displaced fracture. No aggressive lytic lesions are identified. 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 Referral for pulmonary evaluation and bronchoscopy recommended, as well as oncology referral. Confluent airspace disease of right middle lobe and right lower lobe, while most concerning for tumor involvement, could also represent a superimposed pneumonia/aspiration pneumonia. Bronchoscopy may be useful for differentiating infection/tumor. Trace right-sided pleural effusion. Surgical changes of prior median sternotomy, aortic valve replacement, and CABG. Electronically Signed   By: Corrie Mckusick D.O.   On: 05/25/2018 11:18    Bronchoscopy 05/28/2018: Pending biopsy results   Subjective:   Discharge Exam: Vitals:   05/28/18 0537 05/28/18 1035  BP: 124/68   Pulse: 68   Resp: 17   Temp: 98.1 F (36.7 C) 97.7 F (36.5 C)  SpO2: 95%    Vitals:   05/27/18 1342 05/27/18 2154 05/28/18 0537 05/28/18 1035  BP: 125/66 122/69 124/68   Pulse: 61 69 68   Resp: 16 16 17    Temp: (!) 97.5 F (36.4 C) 98.2 F (36.8 C) 98.1 F (36.7 C) 97.7 F (36.5 C)  TempSrc: Oral Oral Oral Oral  SpO2: 94% 94% 95%   Weight:      Height:       General exam: Appears calm and comfortable  Respiratory system: No increased work of breathing, diminished lung sounds on right with scattered crackles, no wheezes or  rhonchi Cardiovascular system: Regular rate and rhythm, no murmurs Gastrointestinal system: Soft, nondistended, no rebound or guarding, plus bowel sounds Central nervous system: Alert and oriented.  Grossly intact, moving all extremities. Extremities: No lower extremity edema Skin: Well-healed midline incision over chest and abdomen Psychiatry: Judgement and insight appear normal. Mood & affect appropriate.    The results of significant diagnostics from this hospitalization (including imaging, microbiology, ancillary and laboratory) are listed below for reference.     Microbiology: No results found for this or any previous visit (from the past 240 hour(s)).   Labs: BNP (last 3 results) No results for input(s): BNP in the last 8760 hours. Basic Metabolic Panel: Recent Labs  Lab 05/25/18 0933 05/26/18 0506 05/27/18 0507 05/28/18 0507  NA 135 137 136 136  K 3.7 4.1 4.5 3.7  CL 104 108 107 105  CO2 22 22 21* 20*  GLUCOSE 102* 101* 88 108*  BUN  23 26* 26* 23  CREATININE 1.15 1.39* 1.54* 1.46*  CALCIUM 8.9 8.4* 8.4* 8.4*   Liver Function Tests: Recent Labs  Lab 05/25/18 0933 05/26/18 0506  AST 48* 69*  ALT 36 44  ALKPHOS 66 58  BILITOT 1.0 0.8  PROT 7.2 5.6*  ALBUMIN 3.2* 2.5*   No results for input(s): LIPASE, AMYLASE in the last 168 hours. No results for input(s): AMMONIA in the last 168 hours. CBC: Recent Labs  Lab 05/25/18 0933 05/26/18 0506 05/27/18 0507 05/28/18 0507  WBC 11.2* 8.4 8.6 9.9  NEUTROABS 8.8*  --   --   --   HGB 12.6* 10.2* 11.5* 11.9*  HCT 39.3 32.2* 36.0* 37.2*  MCV 87.5 88.7 88.7 87.7  PLT 294 232 255 286   Cardiac Enzymes: No results for input(s): CKTOTAL, CKMB, CKMBINDEX, TROPONINI in the last 168 hours. BNP: Invalid input(s): POCBNP CBG: No results for input(s): GLUCAP in the last 168 hours. D-Dimer No results for input(s): DDIMER in the last 72 hours. Hgb A1c No results for input(s): HGBA1C in the last 72 hours. Lipid  Profile No results for input(s): CHOL, HDL, LDLCALC, TRIG, CHOLHDL, LDLDIRECT in the last 72 hours. Thyroid function studies No results for input(s): TSH, T4TOTAL, T3FREE, THYROIDAB in the last 72 hours.  Invalid input(s): FREET3 Anemia work up No results for input(s): VITAMINB12, FOLATE, FERRITIN, TIBC, IRON, RETICCTPCT in the last 72 hours. Urinalysis    Component Value Date/Time   COLORURINE YELLOW 05/29/2008 1251   APPEARANCEUR CLEAR 05/29/2008 1251   LABSPEC 1.014 05/29/2008 1251   PHURINE 5.5 05/29/2008 1251   GLUCOSEU NEGATIVE 05/29/2008 1251   HGBUR NEGATIVE 05/29/2008 1251   BILIRUBINUR NEGATIVE 05/29/2008 1251   KETONESUR NEGATIVE 05/29/2008 1251   PROTEINUR NEGATIVE 05/29/2008 1251   UROBILINOGEN 0.2 05/29/2008 1251   NITRITE NEGATIVE 05/29/2008 1251   LEUKOCYTESUR  05/29/2008 1251    NEGATIVE MICROSCOPIC NOT DONE ON URINES WITH NEGATIVE PROTEIN, BLOOD, LEUKOCYTES, NITRITE, OR GLUCOSE <1000 mg/dL.   Sepsis Labs Invalid input(s): PROCALCITONIN,  WBC,  LACTICIDVEN Microbiology No results found for this or any previous visit (from the past 240 hour(s)).   Time coordinating discharge: 35  SIGNED:   Cristy Folks, MD  [facility] 05/28/2018, 10:44 AM   If 7PM-7AM, please contact night-coverage [email or web address] Password [email or web address]

## 2018-05-29 ENCOUNTER — Encounter (HOSPITAL_COMMUNITY): Payer: Self-pay | Admitting: Emergency Medicine

## 2018-05-29 LAB — ACID FAST SMEAR (AFB, MYCOBACTERIA): Acid Fast Smear: NEGATIVE

## 2018-05-29 LAB — ACID FAST SMEAR (AFB)

## 2018-05-30 ENCOUNTER — Telehealth: Payer: Self-pay | Admitting: Emergency Medicine

## 2018-05-30 LAB — CULTURE, BAL-QUANTITATIVE W GRAM STAIN
Culture: 3000 — AB
Special Requests: NORMAL

## 2018-05-30 LAB — CULTURE, BAL-QUANTITATIVE

## 2018-05-30 NOTE — Telephone Encounter (Signed)
Spoke with pt's daughter, Mariann Laster. States that the pt has seen RB in the hospital recently. Pt had a bronch and believes that his PNA is getting worse. She would like to know if she should take him back to the ED/hospital. Advised her that we could move up his HFU to tomorrow but she states that he is seeing his PCP tomorrow. The only complaint that the pt has at this time is a dry cough. They have been checking his O2 and it's been running 88-90% on room air. Denies chest tightness, wheezing, shortness of breath or fever. Abbie Sons that it would be up to them if they took the pt back to the hospital but I believe waiting to see his PCP tomorrow before going to the hospital would be best for him to avoid picking up any further germs; but if his oxygen level gets lower much lower than 88% on room air, Mariann Laster is aware that he would need to be taken to an [place] agreed but she will call us back if she needs further advice. Nothing further was needed at this time.

## 2018-05-31 ENCOUNTER — Telehealth: Payer: Self-pay | Admitting: Internal Medicine

## 2018-05-31 ENCOUNTER — Telehealth: Payer: Self-pay | Admitting: Emergency Medicine

## 2018-05-31 ENCOUNTER — Encounter: Payer: Self-pay | Admitting: Internal Medicine

## 2018-05-31 DIAGNOSIS — J189 Pneumonia, unspecified organism: Secondary | ICD-10-CM | POA: Diagnosis not present

## 2018-05-31 DIAGNOSIS — C3491 Malignant neoplasm of unspecified part of right bronchus or lung: Secondary | ICD-10-CM | POA: Diagnosis not present

## 2018-05-31 DIAGNOSIS — C3490 Malignant neoplasm of unspecified part of unspecified bronchus or lung: Secondary | ICD-10-CM

## 2018-05-31 DIAGNOSIS — G4700 Insomnia, unspecified: Secondary | ICD-10-CM | POA: Diagnosis not present

## 2018-05-31 DIAGNOSIS — Z09 Encounter for follow-up examination after completed treatment for conditions other than malignant neoplasm: Secondary | ICD-10-CM | POA: Diagnosis not present

## 2018-05-31 DIAGNOSIS — R197 Diarrhea, unspecified: Secondary | ICD-10-CM | POA: Diagnosis not present

## 2018-05-31 NOTE — Telephone Encounter (Signed)
Call made to patient daughter, she states she has several questions regarding her father and his dx. She state she prefers to talk to Jacobus himself. Informed RB is in hospital rotation this week and I would send him the message.   RB please advise. Thanks.

## 2018-05-31 NOTE — Telephone Encounter (Signed)
Spoke w patient today

## 2018-05-31 NOTE — Telephone Encounter (Signed)
Pt's daughter cld and lft a vm to confirm appt w/Dr. Julien Nordmann on 2/10

## 2018-05-31 NOTE — Telephone Encounter (Signed)
A new patient appt has been scheduled for the pt to see Dr. Julien Nordmann on 2/10 at 330pm w/labs at 3pm. I cld and lft the pt a vm with the appt date and time. Letter mailed.

## 2018-05-31 NOTE — Telephone Encounter (Signed)
I spoke with the patient. Reviewed results w him > new dx [facility]. He is local, wants to go to [facility]. I will make referral to [place]. Also will order PET scan and MRI Brain

## 2018-06-03 ENCOUNTER — Encounter (HOSPITAL_COMMUNITY)
Admission: RE | Admit: 2018-06-03 | Discharge: 2018-06-03 | Disposition: A | Discharge status: Home / Self Care | Payer: Medicare Other | Source: Ambulatory Visit | Attending: Emergency Medicine | Admitting: Emergency Medicine

## 2018-06-03 ENCOUNTER — Ambulatory Visit (INDEPENDENT_AMBULATORY_CARE_PROVIDER_SITE_OTHER): Payer: Medicare Other | Admitting: Nurse Practitioner

## 2018-06-03 ENCOUNTER — Encounter: Payer: Self-pay | Admitting: Nurse Practitioner

## 2018-06-03 ENCOUNTER — Ambulatory Visit (HOSPITAL_COMMUNITY)
Admission: RE | Admit: 2018-06-03 | Discharge: 2018-06-03 | Disposition: A | Discharge status: Home / Self Care | Payer: Medicare Other | Source: Ambulatory Visit | Attending: Emergency Medicine | Admitting: Emergency Medicine

## 2018-06-03 VITALS — BP 98/62 | HR 78 | Ht 69.0 in

## 2018-06-03 DIAGNOSIS — R918 Other nonspecific abnormal finding of lung field: Secondary | ICD-10-CM | POA: Diagnosis not present

## 2018-06-03 DIAGNOSIS — J9611 Chronic respiratory failure with hypoxia: Secondary | ICD-10-CM | POA: Diagnosis not present

## 2018-06-03 DIAGNOSIS — C3490 Malignant neoplasm of unspecified part of unspecified bronchus or lung: Secondary | ICD-10-CM

## 2018-06-03 DIAGNOSIS — E440 Moderate protein-calorie malnutrition: Secondary | ICD-10-CM

## 2018-06-03 DIAGNOSIS — G939 Disorder of brain, unspecified: Secondary | ICD-10-CM | POA: Diagnosis not present

## 2018-06-03 LAB — GLUCOSE, CAPILLARY: Glucose-Capillary: 110 mg/dL — ABNORMAL HIGH (ref 70–99)

## 2018-06-03 LAB — COMPREHENSIVE METABOLIC PANEL
ALK PHOS: 520 U/L — AB (ref 39–117)
ALT: 222 U/L — ABNORMAL HIGH (ref 0–53)
AST: 298 U/L — AB (ref 0–37)
Albumin: 2.9 g/dL — ABNORMAL LOW (ref 3.5–5.2)
BUN: 40 mg/dL — ABNORMAL HIGH (ref 6–23)
CO2: 20 mEq/L (ref 19–32)
Calcium: 8.9 mg/dL (ref 8.4–10.5)
Chloride: 100 mEq/L (ref 96–112)
Creatinine, Ser: 1.34 mg/dL (ref 0.40–1.50)
GFR: 50.51 mL/min — ABNORMAL LOW (ref >60.00)
Glucose, Bld: 109 mg/dL — ABNORMAL HIGH (ref 70–99)
Potassium: 4.4 mEq/L (ref 3.5–5.1)
Sodium: 133 mEq/L — ABNORMAL LOW (ref 135–145)
TOTAL PROTEIN: 6.3 g/dL (ref 6.0–8.3)
Total Bilirubin: 0.8 mg/dL (ref 0.2–1.2)

## 2018-06-03 LAB — CBC WITH DIFFERENTIAL/PLATELET
Basophils Absolute: 0 10*3/uL (ref 0.0–0.1)
Basophils Relative: 0.2 % (ref 0.0–3.0)
Eosinophils Absolute: 0 10*3/uL (ref 0.0–0.7)
Eosinophils Relative: 0.1 % (ref 0.0–5.0)
HCT: 43.1 % (ref 39.0–52.0)
Hemoglobin: 14.2 g/dL (ref 13.0–17.0)
Lymphocytes Relative: 2.8 % — ABNORMAL LOW (ref 12.0–46.0)
Lymphs Abs: 0.4 10*3/uL — ABNORMAL LOW (ref 0.7–4.0)
MCHC: 33 g/dL (ref 30.0–36.0)
MCV: 83.8 fl (ref 78.0–100.0)
MONO ABS: 1.8 10*3/uL — AB (ref 0.1–1.0)
Monocytes Relative: 12.6 % — ABNORMAL HIGH (ref 3.0–12.0)
NEUTROS PCT: 84.3 % — AB (ref 43.0–77.0)
Neutro Abs: 12.2 10*3/uL — ABNORMAL HIGH (ref 1.4–7.7)
Platelets: 319 10*3/uL (ref 150.0–400.0)
RBC: 5.14 Mil/uL (ref 4.22–5.81)
RDW: 13.8 % (ref 11.5–15.5)
WBC: 14.5 10*3/uL — ABNORMAL HIGH (ref 4.0–10.5)

## 2018-06-03 MED ORDER — GADOBUTROL 1 MMOL/ML IV SOLN
6.0000 mL | Freq: Once | INTRAVENOUS | Status: AC | PRN
  Administered 2018-06-03: 6 mL via INTRAVENOUS

## 2018-06-03 MED ORDER — FLUDEOXYGLUCOSE F - 18 (FDG) INJECTION: 6.9000 | Freq: Once | INTRAVENOUS | Status: DC

## 2018-06-03 NOTE — Telephone Encounter (Signed)
Pt's daughter Mariann Laster is calling back. She said that he is getting weak his O2 is low.  [phone]

## 2018-06-03 NOTE — Patient Instructions (Signed)
Will order hospice per family request Will check labs and call with results Patient's O2 sats were 87% and sats returned to 93% on 2L [place]  Small frequents meals throughout the day Drink plenty of fluids Get plenty of rest Continue current medications Follow up with Oncology as scheduled next week Follow up with Dr. Lamonte Sakai at his 1st available appointment in around 2-3 weeks Please go to the ED if symptoms worsen

## 2018-06-03 NOTE — Telephone Encounter (Signed)
Called and spoke to pt's daughter, Noah Martin ([place]).  Noah Martin states pt is experiencing increase weakness & low oxygen levels. Noah Martin states pt had PET this morning and pt's spO2 on roomair 86-88%. Pt does not currently have home oxygen.  Pt has been scheduled for HFU on 06/03/18 at 2:30 with [place]. Nothing further is needed.

## 2018-06-03 NOTE — Progress Notes (Signed)
@Patient  ID: Noah Martin, male    DOB: [birth year], 85 y.o.   MRN: [id number]  Chief Complaint  Patient presents with  . Hospitalization Follow-up    Per pt's daughter is here for a hospital f/u. Cough has returned, extreme weakness that started last Wednesday, low O2 that started on and off on Thursday.     Referring provider: London Pepper, MD  HPI 85 year old male former smoker with postobstructive pneumonia and right lower lobe lung mass who is followed by Dr. Lamonte Sakai.  Bronchoscopy on 05/28/2018 -with new diagnosis of NSCLCA - PET scan and MRI completed today.  Tests: PET 06/03/18 - Findings are consistent with widespread metastatic disease to mediastinal lymph nodes, the liver, bilateral adrenal glands and bones (stage IV lung cancer). Progressive collapse and consolidation of the right middle and lower lobes with associated hypermetabolic activity. Small bilateral pleural effusions have mildly enlarged. 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 Underlying hepatic metastatic disease likely, which would be better evaluated with abdominal MRI if clinically warranted. MRI Brain 06/03/18 - 7 mm enhancing lesion in the left frontal lobe compatible with a metastasis. Minimal edema without mass effect or hemorrhage. 2 mm focus of enhancement in the left internal auditory canal. This may represent an incidental vestibular schwannoma. A metastasis is also possible, however there is no evidence of leptomeningeal metastatic disease elsewhere. Moderate cerebral atrophy and moderate to severe chronic small vessel ischemic disease. Small chronic cerebral and cerebellar infarcts. CT chest 05/25/18 - CT findings concerning for a superior segment right lower lobe adenocarcinoma with associated lymphangitic carcinomatosis and metastases to the right upper lobe, right middle lobe, mediastinal lymph nodes, liver, and right adrenal  gland. Referral for pulmonary evaluation and bronchoscopy recommended, as well as oncology referral. Confluent airspace disease of right middle lobe and right lower lobe, while most concerning for tumor involvement, could also represent a superimposed pneumonia/aspiration pneumonia. Bronchoscopy may be useful for differentiating infection/tumor.  OV 06/03/18 - Hospital follow up 05/25/18 - 05/28/18  Patient presents today for hospital follow-up.  He was admitted to the hospital on 05/25/2018 for postobstructive pneumonia.  He was treated with IV Zosyn and discharged home on oral Augmentin.  CT scan showed in hospital showed widespread evidence of lung cancer including lymph nodes, metastasis to liver and adrenal gland.  Patient had a bronchoscopy by Dr. Lamonte Sakai on 05/28/2018 with biopsies which showed [place].  Dr. Lamonte Sakai ordered a PET scan and MRI of brain following bronchoscopy.  Patient had the scans done today.  PET scan showed widespread metastatic disease and MRI of brain showed lesion in left frontal lobe of brain.  Patient's son and daughter are with him today and states that he has been very weak since hospital discharge.  He is not eating or drinking well.  They are concerned because his O2 sats have been low.  He denies any recent fever.  He denies any chest pain or edema.   No Known Allergies   There is no immunization history on file for this patient.  Past Medical History:  Diagnosis Date  . Acute renal insufficiency    postoperatively  . Anemia   . Aortic insufficiency    mild, -- with a markedly dilated aortic root  . Aortic stenosis    status post aortic valve replacement  . Atrial fibrillation ([place])    Intraoperative and postoperative atrial fibrillation  . Coronary artery disease    status  post CABG x1 -- with endo vein to the RCA  . Hyperlipidemia   . Hypertension   . Nephrolithiasis   . Peptic ulcer    status post gastrectomy  . Pulmonary nodules    chronic -- 2008-2009  without change  . Right coronary artery occlusion ([facility])    Chronic occlusion of right coronary artery    Tobacco History: Social History   Tobacco Use  Smoking Status Former Smoker  . Packs/day: 1.00  . Years: 14.00  . Pack years: 14.00  . Types: Cigarettes  . Last attempt to quit: 12/04/1972  . Years since quitting: 45.5  Smokeless Tobacco Never Used   Counseling given: Not Answered   Outpatient Encounter Medications as of 06/03/2018  Medication Sig  . acetaminophen (TYLENOL) 500 MG tablet Take 1,000 mg by mouth every 6 (six) hours as needed for mild pain or headache.  . ALPRAZolam (XANAX) 0.25 MG tablet Take 0.25 mg by mouth at bedtime as needed.  Marland Kitchen amoxicillin-clavulanate (AUGMENTIN) 875-125 MG tablet Take 1 tablet by mouth 2 (two) times daily for 7 days.  Marland Kitchen aspirin EC 81 MG tablet Take 1 tablet (81 mg total) by mouth daily.  Marland Kitchen azithromycin (ZITHROMAX) 250 MG tablet Take 250-500 mg by mouth See admin instructions. Take 500 mg by mouth on day 1 then take 250 mg by mouth on days 2-5  . benzonatate (TESSALON) 100 MG capsule Take 1 capsule (100 mg total) by mouth every 8 (eight) hours as needed for cough.  . ezetimibe (ZETIA) 10 MG tablet Take 1 tablet (10 mg total) by mouth daily. (Patient not taking: Reported on 06/03/2018)  . lansoprazole (PREVACID) 30 MG capsule TAKE ONE CAPSULE BY MOUTH DAILY (Patient taking differently: Take 30 mg by mouth daily. )  . losartan (COZAAR) 50 MG tablet Take 50 mg by mouth daily.  . tamsulosin (FLOMAX) 0.4 MG CAPS capsule Take 0.4 mg by mouth daily.  . [DISCONTINUED] ezetimibe (ZETIA) 10 MG tablet TAKE 1 TABLET DAILY (Patient not taking: Reported on 05/25/2018)  . [DISCONTINUED] simvastatin (ZOCOR) 40 MG tablet TAKE ONE TABLET BY MOUTH DAILY AT 6 PM (Patient taking differently: Take 40 mg by mouth daily at 6 PM. )   Facility-Administered Encounter Medications as of 06/03/2018  Medication  . fludeoxyglucose F - 18 (FDG) injection 6.9 millicurie      Review of Systems  Review of Systems  Constitutional: Positive for activity change, appetite change, fatigue and unexpected weight change. Negative for fever.  Respiratory: Positive for shortness of breath.   Cardiovascular: Negative for chest pain, palpitations and leg swelling.       Physical Exam  BP 98/62   Pulse 78   Ht 5\' 9"  (1.753 m)   SpO2 93%   BMI 20.67 kg/m   Wt Readings from Last 5 Encounters:  05/25/18 140 lb (63.5 kg)  07/30/17 151 lb 12.8 oz (68.9 kg)  05/02/16 154 lb (69.9 kg)  03/12/15 157 lb (71.2 kg)  02/19/14 159 lb 14.4 oz (72.5 kg)     Physical Exam Vitals signs and nursing note reviewed.  Constitutional:      General: He is awake. He is not in acute distress.    Appearance: He is underweight. He is ill-appearing.  Cardiovascular:     Rate and Rhythm: Normal rate and regular rhythm.  Pulmonary:     Effort: Pulmonary effort is normal. No respiratory distress.     Breath sounds: Normal breath sounds. No wheezing or rhonchi.  Musculoskeletal:  General: No swelling.  Skin:    General: Skin is warm and dry.  Neurological:     Mental Status: He is alert and oriented to person, place, and time.     Imaging: Dg Chest 2 View  Result Date: 05/23/2018 CLINICAL DATA:  Re-evaluate pneumonia. EXAM: CHEST - 2 VIEW COMPARISON:  05/09/2018 FINDINGS: Previous median sternotomy CABG procedure. Progressive airspace consolidation involving the right lower lobe identified. There is a small right pleural effusion which appears increased from previous exam. Left lung remains clear. IMPRESSION: 1. Worsening aeration to the right lower lobe compatible with progression of pneumonia. Followup PA and lateral chest X-ray is recommended in 3-4 weeks following trial of antibiotic therapy to ensure resolution and exclude underlying malignancy. If this does not resolve further investigation with CT of the chest is advised to assess for underlying mass.  Electronically Signed   By: Kerby Moors M.D.   On: 05/23/2018 15:54   Dg Chest 2 View  Result Date: 05/09/2018 CLINICAL DATA:  Dry cough EXAM: CHEST - 2 VIEW COMPARISON:  06/29/2008 FINDINGS: Consolidation in the right lower lobe compatible with pneumonia. Prior CABG and aortic valve repair. Heart is normal size. Left lung clear. No effusions or acute bony abnormality. IMPRESSION: Right lower lobe pneumonia. Followup PA and lateral chest X-ray is recommended in 3-4 weeks following trial of antibiotic therapy to ensure resolution and exclude underlying malignancy. Electronically Signed   By: Rolm Baptise M.D.   On: 05/09/2018 11:23   Ct Chest W Contrast  Result Date: 05/25/2018 CLINICAL DATA:  85 year old male with a history of pneumonia, without fever or pain and no approve min after antibiotics EXAM: CT CHEST WITH CONTRAST TECHNIQUE: Multidetector CT imaging of the chest was performed during intravenous contrast administration. CONTRAST:  55mL OMNIPAQUE IOHEXOL 300 MG/ML  SOLN COMPARISON:  11/11/2007, 07/20/2006, 01/15/2006 FINDINGS: Cardiovascular: Heart size within normal limits. No pericardial fluid/thickening. Dense calcifications of the left main, left anterior descending, circumflex, right coronary arteries. Surgical changes of aortic valve repair and CABG. Median sternotomy. Calcifications of the aortic arch. Branch vessels are patent. Cervical cerebral vessels patent at the base of the neck. No dissection or aneurysm of descending thoracic aorta. Unremarkable diameter of the main pulmonary artery. Evaluation for filling defects limited by the contrast bolus timing. Mediastinum/Nodes: Multiple borderline enlarged lymph nodes throughout the mediastinum in all nodes stations. The index node in the lowest paratracheal station measures 16 mm. Subcarinal node measures 12 mm. Right hilar lymph nodes present. AP window nodes present. Lungs/Pleura: Confluent opacity involving the right middle lobe, with  minimal aeration maintained of the right middle lobe. Confluent airspace opacity in the superior aspect of the right lower lobe with multiple bronchial airway occlusions including the right middle lobe, and the segments of the right lower lobe to the superior and medial segments. There is a more heterogeneously attenuating/enhancing region associated with the stumped bronchus to the superior segment of the right lower lobe (image 94 of series 2. Significant interlobular septal thickening involving right middle lobe, right lower lobe, and the adjacent segments of the right upper lobe. Additionally there are small nodules along the proximal bronchi of the right upper lobe. These are most apparent on image 66, 70, 68 of series 5. Additional nodule of the right upper lobe image 82 of series 5. Upper Abdomen: New ill-defined heterogeneously attenuating nodule of segment 2 of the liver measuring 2.7 cm as well as a ill-defined heterogeneously attenuating lesion of segment 3 measuring 3.8 cm  by 4.1 cm. The right aspect of the liver demonstrates decreased perfusion, which may be flow related. Nodularity of the right adrenal gland, new from comparison chest CTs. Surgical changes at the epigastric region. Musculoskeletal: No acute displaced fracture. No aggressive lytic lesions are identified. 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 Referral for pulmonary evaluation and bronchoscopy recommended, as well as oncology referral. Confluent airspace disease of right middle lobe and right lower lobe, while most concerning for tumor involvement, could also represent a superimposed pneumonia/aspiration pneumonia. Bronchoscopy may be useful for differentiating infection/tumor. Trace right-sided pleural effusion. Surgical changes  of prior median sternotomy, aortic valve replacement, and CABG. Electronically Signed   By: Corrie Mckusick D.O.   On: 05/25/2018 11:18   Mr Jeri Cos HM Contrast  Result Date: 06/03/2018 CLINICAL DATA:  Non-small cell lung cancer staging. Worsening confusion and altered level of consciousness. EXAM: MRI HEAD WITHOUT AND WITH CONTRAST TECHNIQUE: Multiplanar, multiecho pulse sequences of the brain and surrounding structures were obtained without and with intravenous contrast. CONTRAST:  6 mL Gadavist COMPARISON:  None. FINDINGS: The study is mildly motion degraded. Brain: There is no evidence of acute infarct, intracranial hemorrhage, midline shift, or extra-axial fluid collection. There is moderate cerebral atrophy. Patchy to confluent T2 hyperintensities in the cerebral white matter bilaterally are nonspecific but compatible with moderate to severe chronic small vessel ischemic disease. Small chronic cortical infarcts are present in the left frontal and bilateral parietal lobes. There are also small chronic infarcts in the cerebellum bilaterally. There is a 7 mm ring-enhancing lesion in the anteromedial left frontal lobe with minimal surrounding edema and no mass effect (series 11, image 38). A 2 mm nodular focus of enhancement is present in the fundus of the left internal auditory canal (series 11, image 19). Vascular: Major intracranial vascular flow voids are preserved. Skull and upper cervical spine: No suspicious marrow lesion. Sinuses/Orbits: Bilateral cataract extraction. Posterior left ethmoid air cell mucosal thickening. Clear mastoid air cells. Other: None. IMPRESSION: 1. 7 mm enhancing lesion in the left frontal lobe compatible with a metastasis. Minimal edema without mass effect or hemorrhage. 2. 2 mm focus of enhancement in the left internal auditory canal. This may represent an incidental vestibular schwannoma. A metastasis is also possible, however there is no evidence of leptomeningeal metastatic  disease elsewhere. 3. Moderate cerebral atrophy and moderate to severe chronic small vessel ischemic disease. 4. Small chronic cerebral and cerebellar infarcts. Electronically Signed   By: Logan Bores M.D.   On: 06/03/2018 09:55   Nm Pet Image Initial (pi) Skull Base To Thigh  Result Date: 06/03/2018 CLINICAL DATA:  Initial treatment strategy for non-small-cell lung cancer. EXAM: NUCLEAR MEDICINE PET SKULL BASE TO THIGH TECHNIQUE: 6.9 mCi F-18 FDG was injected intravenously. Full-ring PET imaging was performed from the skull base to thigh after the radiotracer. CT data was obtained and used for attenuation correction and anatomic localization. Fasting blood glucose: 110 mg/dl COMPARISON:  Chest CT 05/25/2018 and 11/11/2007. FINDINGS: Mediastinal blood pool activity: SUV max 1.5 NECK: No hypermetabolic cervical lymph nodes are identified.There are no lesions of the pharyngeal mucosal space. Incidental CT findings: Bilateral carotid atherosclerosis. CHEST: There is progressive collapse and consolidation within the right middle and lower lobes. There is associated hypermetabolic activity, especially in the superior segment of the right lower lobe (SUV max 13.6).  There is increased ground-glass opacity dependently in the right upper lobe without associated hypermetabolic activity. No suspicious pulmonary activity is seen within the left lung. There are associated hypermetabolic mediastinal and right hilar lymph nodes. AP window node measuring 15 mm on image 71/4 has an SUV max of 7.5. Right hilar node has an SUV max of 8.1. Incidental CT findings: Diffuse atherosclerosis of the aorta, great vessels and coronary arteries status post median sternotomy, CABG and aortic valve replacement. Small right-greater-than-left pleural effusions have mildly enlarged, without associated hypermetabolic activity. ABDOMEN/PELVIS: The liver is grossly normal, with marked hypermetabolic activity throughout the left lobe (SUV max 11.0).  This corresponds with decreased density on the CT images and apparent hyper perfusion on recent chest CT. 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 Underlying hepatic metastatic disease is likely, although suboptimally evaluated given these vascular changes. Posteriorly in the right hepatic lobe, there is an ill-defined area of increased metabolic activity (SUV max 7.2), likely a metastasis. There is bilateral adrenal hypermetabolic activity suspicious for metastatic disease (SUV max 5.6 on the right and 4.5 on the left). There is no hypermetabolic nodal activity. Incidental CT findings: There is a small amount of ascites without associated hypermetabolic activity. There is diffuse aortic and branch vessel atherosclerosis, sigmoid colon diverticulosis and mild prostatomegaly. SKELETON: Multifocal hypermetabolic osseous metastases are present, largest in the left iliac wing (SUV max 9.2, measuring approximately 2.1 cm on image 164/4). No obvious epidural tumor or pathologic fracture. Incidental CT findings: none IMPRESSION: 1. Findings are consistent with widespread metastatic disease to mediastinal lymph nodes, the liver, bilateral adrenal glands and bones (stage IV lung cancer). 2. Progressive collapse and consolidation of the right middle and lower lobes with associated hypermetabolic activity. Small bilateral pleural effusions have mildly enlarged. 3. 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 Underlying hepatic metastatic disease likely, which would be better evaluated with abdominal MRI if clinically warranted. Electronically Signed   By: Richardean Sale M.D.   On: 06/03/2018 09:10     Assessment & Plan:   Right lower lobe lung mass Discussion: The results of the PET scan and MRI of the brain that were done today were discussed with patient and family.  Patient has a follow-up  scheduled with oncology next week.  Considering results of PET scan -  patient and family would like to go ahead and start a referral for hospice care until they can speak with oncology to see what their options are for treatment.  Patient does qualify for O2 today.  Order placed for home O2 at 2 L to keep sats above 90%.  We discussed the option that patient could go back to the hospital and be treated with IV fluids/nutrition due to the fact that he has not been eating or drinking well and is very weak.  Patient would like to try to go home and eat and drink on his own.  If he cannot keep fluids down he will go to the ED.  Patient Instructions  Will order hospice per family request Will check labs and call with results Patient's O2 sats were 87% and sats returned to 93% on 2L [place]  Small frequents meals throughout the day Drink plenty of fluids Get plenty of rest Continue current medications Follow up with Oncology as scheduled next week Follow up with Dr. Lamonte Sakai at his 1st available appointment in around 2-3  weeks Please go to the ED if symptoms worsen       Fenton Foy, NP 06/04/2018

## 2018-06-04 ENCOUNTER — Telehealth: Payer: Self-pay | Admitting: Emergency Medicine

## 2018-06-04 ENCOUNTER — Encounter: Payer: Self-pay | Admitting: Nurse Practitioner

## 2018-06-04 DIAGNOSIS — I10 Essential (primary) hypertension: Secondary | ICD-10-CM | POA: Diagnosis not present

## 2018-06-04 DIAGNOSIS — C7800 Secondary malignant neoplasm of unspecified lung: Secondary | ICD-10-CM | POA: Diagnosis not present

## 2018-06-04 DIAGNOSIS — C787 Secondary malignant neoplasm of liver and intrahepatic bile duct: Secondary | ICD-10-CM | POA: Diagnosis not present

## 2018-06-04 DIAGNOSIS — I2510 Atherosclerotic heart disease of native coronary artery without angina pectoris: Secondary | ICD-10-CM | POA: Diagnosis not present

## 2018-06-04 DIAGNOSIS — N401 Enlarged prostate with lower urinary tract symptoms: Secondary | ICD-10-CM | POA: Diagnosis not present

## 2018-06-04 DIAGNOSIS — C7970 Secondary malignant neoplasm of unspecified adrenal gland: Secondary | ICD-10-CM | POA: Diagnosis not present

## 2018-06-04 DIAGNOSIS — C7931 Secondary malignant neoplasm of brain: Secondary | ICD-10-CM | POA: Diagnosis not present

## 2018-06-04 DIAGNOSIS — E785 Hyperlipidemia, unspecified: Secondary | ICD-10-CM | POA: Diagnosis not present

## 2018-06-04 DIAGNOSIS — K219 Gastro-esophageal reflux disease without esophagitis: Secondary | ICD-10-CM | POA: Diagnosis not present

## 2018-06-04 DIAGNOSIS — C7951 Secondary malignant neoplasm of bone: Secondary | ICD-10-CM | POA: Diagnosis not present

## 2018-06-04 DIAGNOSIS — C3431 Malignant neoplasm of lower lobe, right bronchus or lung: Secondary | ICD-10-CM | POA: Diagnosis not present

## 2018-06-04 DIAGNOSIS — G893 Neoplasm related pain (acute) (chronic): Secondary | ICD-10-CM | POA: Diagnosis not present

## 2018-06-04 DIAGNOSIS — J910 Malignant pleural effusion: Secondary | ICD-10-CM | POA: Diagnosis not present

## 2018-06-04 NOTE — Telephone Encounter (Signed)
He needs O2 out to his house today. Please contact hospice and confirm that he will get it today. We sent him home with a tank yesterday and thought Noah Martin would get him O2 out yesterday afternoon.

## 2018-06-04 NOTE — Telephone Encounter (Signed)
Spoke with [facility] and requested that oxygen be delivered today.  I was advised we were waiting for clarification on attending provider per 06/04/18 and it appears Dr. Lamonte Sakai will be attending provider.  Was advised this information will be sent to Amy and oxygen will be delivered today.  Spoke with Angelia from [facility] and clarified that hospice will be delivering the oxygen.  Nothing further needed.

## 2018-06-04 NOTE — Telephone Encounter (Signed)
Called and spoke with Tabiona, [facility]. She stated that she pulled the O2 order, but saw in the [place] notes 06/03/18, that hospice was ordered per family request.  Levada Dy stated that if hospice is called in they will take care of O2, but if not it will go through insurance, and [facility] can take care of O2.   Di Kindle, NP, please clarify

## 2018-06-04 NOTE — Assessment & Plan Note (Signed)
Discussion: The results of the PET scan and MRI of the brain that were done today were discussed with patient and family.  Patient has a follow-up scheduled with oncology next week.  Considering results of PET scan -  patient and family would like to go ahead and start a referral for hospice care until they can speak with oncology to see what their options are for treatment.  Patient does qualify for O2 today.  Order placed for home O2 at 2 L to keep sats above 90%.  We discussed the option that patient could go back to the hospital and be treated with IV fluids/nutrition due to the fact that he has not been eating or drinking well and is very weak.  Patient would like to try to go home and eat and drink on his own.  If he cannot keep fluids down he will go to the ED.  Patient Instructions  Will order hospice per family request Will check labs and call with results Patient's O2 sats were 87% and sats returned to 93% on 2L [place]  Small frequents meals throughout the day Drink plenty of fluids Get plenty of rest Continue current medications Follow up with Oncology as scheduled next week Follow up with Dr. Lamonte Sakai at his 1st available appointment in around 2-3 weeks Please go to the ED if symptoms worsen

## 2018-06-04 NOTE — Telephone Encounter (Signed)
Called and spoke with Noah Martin from Hospice. A referral was sent in today by [facility] and [facility]ospice is wanting to know if Dr. Lamonte Sakai will be the attending physician.    RB please advise, thank you.

## 2018-06-05 ENCOUNTER — Telehealth: Payer: Self-pay | Admitting: Emergency Medicine

## 2018-06-05 DIAGNOSIS — R918 Other nonspecific abnormal finding of lung field: Secondary | ICD-10-CM

## 2018-06-05 DIAGNOSIS — C3431 Malignant neoplasm of lower lobe, right bronchus or lung: Secondary | ICD-10-CM | POA: Diagnosis not present

## 2018-06-05 DIAGNOSIS — C7951 Secondary malignant neoplasm of bone: Secondary | ICD-10-CM | POA: Diagnosis not present

## 2018-06-05 DIAGNOSIS — C7800 Secondary malignant neoplasm of unspecified lung: Secondary | ICD-10-CM | POA: Diagnosis not present

## 2018-06-05 DIAGNOSIS — C7970 Secondary malignant neoplasm of unspecified adrenal gland: Secondary | ICD-10-CM | POA: Diagnosis not present

## 2018-06-05 DIAGNOSIS — C787 Secondary malignant neoplasm of liver and intrahepatic bile duct: Secondary | ICD-10-CM | POA: Diagnosis not present

## 2018-06-05 DIAGNOSIS — C7931 Secondary malignant neoplasm of brain: Secondary | ICD-10-CM | POA: Diagnosis not present

## 2018-06-05 NOTE — Telephone Encounter (Signed)
Pt is under hospice care.  Order ws placed on 06/04/18 for home health.  Per Melissa at [facility] they are not able to provide home health care due to pt being followed by hospice.  In home health note it says Dr Lamonte Sakai will be signing orders, will Dr Lamonte Sakai be attending for hospice as well?

## 2018-06-05 NOTE — Telephone Encounter (Signed)
Call made to Amy at Hospice to make aware RB will be the attending. Voiced understanding. Nothing further is needed at this time.

## 2018-06-05 NOTE — Telephone Encounter (Signed)
Call made to Amy at [facility]osp[facility] to make aware RB will be the attending. Order sent to [facility] to D/C home health order. [facility] to [facility] to make aware. Nothing further is needed at this time.

## 2018-06-05 NOTE — Telephone Encounter (Signed)
Per RB's preferences, if our office orders the hospice referral he will be the attending MD.

## 2018-06-05 NOTE — Telephone Encounter (Signed)
Per [facility] should be handling everything for the pt. Per RB's preferences, if our office orders the hospice referral he will be the attending MD.

## 2018-06-06 ENCOUNTER — Inpatient Hospital Stay: Payer: Medicare Other | Admitting: Emergency Medicine

## 2018-06-06 DIAGNOSIS — C7970 Secondary malignant neoplasm of unspecified adrenal gland: Secondary | ICD-10-CM | POA: Diagnosis not present

## 2018-06-06 DIAGNOSIS — C7931 Secondary malignant neoplasm of brain: Secondary | ICD-10-CM | POA: Diagnosis not present

## 2018-06-06 DIAGNOSIS — C7951 Secondary malignant neoplasm of bone: Secondary | ICD-10-CM | POA: Diagnosis not present

## 2018-06-06 DIAGNOSIS — C7800 Secondary malignant neoplasm of unspecified lung: Secondary | ICD-10-CM | POA: Diagnosis not present

## 2018-06-06 DIAGNOSIS — C3431 Malignant neoplasm of lower lobe, right bronchus or lung: Secondary | ICD-10-CM | POA: Diagnosis not present

## 2018-06-06 DIAGNOSIS — C787 Secondary malignant neoplasm of liver and intrahepatic bile duct: Secondary | ICD-10-CM | POA: Diagnosis not present

## 2018-06-07 ENCOUNTER — Telehealth: Payer: Self-pay | Admitting: Emergency Medicine

## 2018-06-07 DIAGNOSIS — C787 Secondary malignant neoplasm of liver and intrahepatic bile duct: Secondary | ICD-10-CM | POA: Diagnosis not present

## 2018-06-07 DIAGNOSIS — C7951 Secondary malignant neoplasm of bone: Secondary | ICD-10-CM | POA: Diagnosis not present

## 2018-06-07 DIAGNOSIS — C3431 Malignant neoplasm of lower lobe, right bronchus or lung: Secondary | ICD-10-CM | POA: Diagnosis not present

## 2018-06-07 DIAGNOSIS — C7800 Secondary malignant neoplasm of unspecified lung: Secondary | ICD-10-CM | POA: Diagnosis not present

## 2018-06-07 DIAGNOSIS — C7931 Secondary malignant neoplasm of brain: Secondary | ICD-10-CM | POA: Diagnosis not present

## 2018-06-07 DIAGNOSIS — C7970 Secondary malignant neoplasm of unspecified adrenal gland: Secondary | ICD-10-CM | POA: Diagnosis not present

## 2018-06-10 ENCOUNTER — Other Ambulatory Visit: Payer: Medicare Other

## 2018-06-10 ENCOUNTER — Ambulatory Visit: Payer: Medicare Other | Admitting: Internal Medicine

## 2018-06-11 ENCOUNTER — Telehealth: Payer: Self-pay

## 2018-06-11 NOTE — Telephone Encounter (Signed)
Received dc form [employer] (original). DC is for cremation. Patient is a patient of Doctor Byrum. DC will be taken to [facility] for signature.  On 06/13/2018 I received dc back from Doctor Byrum. I called funeral home to let them know dc is ready for pickup and also faxed a copy to the funeral home per the funeral home request.

## 2018-06-14 ENCOUNTER — Telehealth: Payer: Self-pay | Admitting: Emergency Medicine

## 2018-06-14 NOTE — Telephone Encounter (Signed)
06/14/18 Received D/C back from, [employer] was not signed by Dr. Lamonte Sakai. Will take tp dr/ Byrum at Pulmonary to be signed. PWR   06/18/18 Received D/C back signed by Dr.Byrum I called patients daughter to let her know we have it back and we are calling [employer] to pick up.

## 2018-06-17 NOTE — Telephone Encounter (Signed)
Received D/C from [facility]. Given to [place] for RB to sign when he is available.

## 2018-06-17 NOTE — Telephone Encounter (Signed)
Call made to Larena Glassman in medical records she is looking for a death certificate for this patient, she states she sent it over on Friday and wanted to be sure we received it. She states she needs it signed as soon as possible.   Ria Comment please advise if you guys have received this. Thanks.

## 2018-06-17 NOTE — Telephone Encounter (Signed)
I have not received anything on this pt. 

## 2018-06-24 ENCOUNTER — Ambulatory Visit: Payer: Medicare Other | Admitting: Emergency Medicine

## 2018-06-25 LAB — FUNGUS CULTURE WITH STAIN

## 2018-06-25 LAB — FUNGUS CULTURE RESULT

## 2018-06-25 LAB — FUNGAL ORGANISM REFLEX

## 2018-06-30 NOTE — Telephone Encounter (Signed)
Called [facility] and was unable to reach. Will forward this over to [employer] for an FYI.

## 2018-06-30 NOTE — Telephone Encounter (Signed)
Thank you for letting me know

## 2018-06-30 DEATH — deceased

## 2018-07-03 ENCOUNTER — Telehealth: Payer: Self-pay | Admitting: Emergency Medicine

## 2018-07-03 NOTE — Telephone Encounter (Signed)
Spoke with [employer] with [place]. Advised her that I have these forms but RB will not be back in the office to sign them until Friday. Nothing further was needed.

## 2018-07-11 LAB — ACID FAST CULTURE WITH REFLEXED SENSITIVITIES (MYCOBACTERIA): Acid Fast Culture: NEGATIVE

## 2018-07-31 ENCOUNTER — Ambulatory Visit: Payer: Medicare Other | Admitting: Cardiology
